# Patient Record
Sex: Male | Born: 1945
Health system: Southern US, Community
[De-identification: ages and names within clinical notes are randomized; demographics above are authoritative.]

## PROBLEM LIST (undated history)

## (undated) DIAGNOSIS — M549 Dorsalgia, unspecified: Secondary | ICD-10-CM

## (undated) DIAGNOSIS — I2510 Atherosclerotic heart disease of native coronary artery without angina pectoris: Secondary | ICD-10-CM

## (undated) DIAGNOSIS — I10 Essential (primary) hypertension: Secondary | ICD-10-CM

## (undated) DIAGNOSIS — R7302 Impaired glucose tolerance (oral): Secondary | ICD-10-CM

## (undated) DIAGNOSIS — E785 Hyperlipidemia, unspecified: Secondary | ICD-10-CM

## (undated) DIAGNOSIS — M1990 Unspecified osteoarthritis, unspecified site: Secondary | ICD-10-CM

## (undated) DIAGNOSIS — C801 Malignant (primary) neoplasm, unspecified: Secondary | ICD-10-CM

## (undated) DIAGNOSIS — T7840XA Allergy, unspecified, initial encounter: Secondary | ICD-10-CM

## (undated) DIAGNOSIS — H269 Unspecified cataract: Secondary | ICD-10-CM

## (undated) DIAGNOSIS — N390 Urinary tract infection, site not specified: Secondary | ICD-10-CM

## (undated) DIAGNOSIS — S22000A Wedge compression fracture of unspecified thoracic vertebra, initial encounter for closed fracture: Secondary | ICD-10-CM

## (undated) HISTORY — PX: POLYPECTOMY: SHX149

## (undated) HISTORY — DX: Dorsalgia, unspecified: M54.9

## (undated) HISTORY — DX: Impaired glucose tolerance (oral): R73.02

## (undated) HISTORY — DX: Unspecified cataract: H26.9

## (undated) HISTORY — PX: COLONOSCOPY: SHX174

## (undated) HISTORY — DX: Wedge compression fracture of unspecified thoracic vertebra, initial encounter for closed fracture: S22.000A

## (undated) HISTORY — DX: Unspecified osteoarthritis, unspecified site: M19.90

## (undated) HISTORY — DX: Hyperlipidemia, unspecified: E78.5

## (undated) HISTORY — DX: Essential (primary) hypertension: I10

## (undated) HISTORY — PX: CATARACT EXTRACTION, BILATERAL: SHX1313

## (undated) HISTORY — PX: HERNIA REPAIR: SHX51

## (undated) HISTORY — DX: Atherosclerotic heart disease of native coronary artery without angina pectoris: I25.10

## (undated) HISTORY — PX: EYE SURGERY: SHX253

## (undated) HISTORY — PX: DENTAL SURGERY: SHX609

## (undated) HISTORY — DX: Allergy, unspecified, initial encounter: T78.40XA

---

## 2004-03-23 ENCOUNTER — Ambulatory Visit: Payer: Self-pay | Admitting: Internal Medicine

## 2004-04-16 ENCOUNTER — Ambulatory Visit: Payer: Self-pay | Admitting: Internal Medicine

## 2004-06-14 ENCOUNTER — Ambulatory Visit: Payer: Self-pay | Admitting: Internal Medicine

## 2004-07-07 ENCOUNTER — Ambulatory Visit: Payer: Self-pay | Admitting: Gastroenterology

## 2004-07-21 ENCOUNTER — Ambulatory Visit: Payer: Self-pay | Admitting: Gastroenterology

## 2005-02-16 ENCOUNTER — Ambulatory Visit: Payer: Self-pay | Admitting: Internal Medicine

## 2005-03-16 ENCOUNTER — Ambulatory Visit: Payer: Self-pay | Admitting: Internal Medicine

## 2005-04-27 ENCOUNTER — Ambulatory Visit: Payer: Self-pay | Admitting: Internal Medicine

## 2005-08-15 ENCOUNTER — Ambulatory Visit: Payer: Self-pay | Admitting: Internal Medicine

## 2005-08-29 ENCOUNTER — Ambulatory Visit: Payer: Self-pay | Admitting: Internal Medicine

## 2006-08-24 ENCOUNTER — Ambulatory Visit: Payer: Self-pay | Admitting: Internal Medicine

## 2007-03-05 ENCOUNTER — Ambulatory Visit: Payer: Self-pay | Admitting: Internal Medicine

## 2007-03-05 LAB — CONVERTED CEMR LAB
ALT: 29 units/L (ref 0–53)
Alkaline Phosphatase: 76 units/L (ref 39–117)
BUN: 14 mg/dL (ref 6–23)
Basophils Absolute: 0 10*3/uL (ref 0.0–0.1)
Basophils Relative: 0.4 % (ref 0.0–1.0)
Bilirubin Urine: NEGATIVE
Blood in Urine, dipstick: NEGATIVE
Calcium: 9.4 mg/dL (ref 8.4–10.5)
Cholesterol: 196 mg/dL (ref 0–200)
Eosinophils Absolute: 0.2 10*3/uL (ref 0.0–0.6)
GFR calc Af Amer: 110 mL/min
GFR calc non Af Amer: 91 mL/min
HDL: 32.4 mg/dL — ABNORMAL LOW (ref >39.0)
Ketones, urine, test strip: NEGATIVE
Lymphocytes Relative: 35.5 % (ref 12.0–46.0)
MCHC: 33.8 g/dL (ref 30.0–36.0)
MCV: 90.9 fL (ref 78.0–100.0)
Monocytes Absolute: 0.6 10*3/uL (ref 0.2–0.7)
Monocytes Relative: 9.9 % (ref 3.0–11.0)
Neutro Abs: 3.4 10*3/uL (ref 1.4–7.7)
Nitrite: NEGATIVE
Platelets: 186 10*3/uL (ref 150–400)
Potassium: 4.4 meq/L (ref 3.5–5.1)
TSH: 2.59 microintl units/mL (ref 0.35–5.50)
Triglycerides: 104 mg/dL (ref 0–149)
VLDL: 21 mg/dL (ref 0–40)

## 2007-03-09 ENCOUNTER — Encounter: Payer: Self-pay | Admitting: Internal Medicine

## 2007-03-12 ENCOUNTER — Ambulatory Visit: Payer: Self-pay | Admitting: Internal Medicine

## 2007-03-12 DIAGNOSIS — M549 Dorsalgia, unspecified: Secondary | ICD-10-CM | POA: Insufficient documentation

## 2007-03-12 DIAGNOSIS — I10 Essential (primary) hypertension: Secondary | ICD-10-CM

## 2007-03-12 DIAGNOSIS — M1990 Unspecified osteoarthritis, unspecified site: Secondary | ICD-10-CM | POA: Insufficient documentation

## 2007-03-12 HISTORY — DX: Unspecified osteoarthritis, unspecified site: M19.90

## 2007-03-12 HISTORY — DX: Essential (primary) hypertension: I10

## 2007-03-12 HISTORY — DX: Dorsalgia, unspecified: M54.9

## 2008-04-18 HISTORY — PX: CARDIAC CATHETERIZATION: SHX172

## 2008-07-31 ENCOUNTER — Inpatient Hospital Stay (HOSPITAL_COMMUNITY): Admission: EM | Admit: 2008-07-31 | Discharge: 2008-08-01 | Payer: Self-pay | Admitting: Emergency Medicine

## 2008-08-14 ENCOUNTER — Encounter: Payer: Self-pay | Admitting: Internal Medicine

## 2008-08-18 ENCOUNTER — Ambulatory Visit: Payer: Self-pay | Admitting: Internal Medicine

## 2008-08-18 DIAGNOSIS — I2510 Atherosclerotic heart disease of native coronary artery without angina pectoris: Secondary | ICD-10-CM

## 2008-08-18 DIAGNOSIS — E785 Hyperlipidemia, unspecified: Secondary | ICD-10-CM

## 2008-08-18 HISTORY — DX: Atherosclerotic heart disease of native coronary artery without angina pectoris: I25.10

## 2008-08-18 HISTORY — DX: Hyperlipidemia, unspecified: E78.5

## 2008-09-19 ENCOUNTER — Encounter: Payer: Self-pay | Admitting: Internal Medicine

## 2008-11-17 ENCOUNTER — Ambulatory Visit: Payer: Self-pay | Admitting: Internal Medicine

## 2008-11-17 LAB — CONVERTED CEMR LAB
AST: 24 units/L (ref 0–37)
Cholesterol: 131 mg/dL (ref 0–200)

## 2009-03-23 ENCOUNTER — Ambulatory Visit: Payer: Self-pay | Admitting: Internal Medicine

## 2009-03-23 LAB — CONVERTED CEMR LAB: Cholesterol: 127 mg/dL (ref 0–200)

## 2009-07-21 ENCOUNTER — Ambulatory Visit: Payer: Self-pay | Admitting: Internal Medicine

## 2009-07-21 LAB — CONVERTED CEMR LAB
ALT: 31 units/L (ref 0–53)
AST: 25 units/L (ref 0–37)
Albumin: 4.1 g/dL (ref 3.5–5.2)
Alkaline Phosphatase: 82 units/L (ref 39–117)
Basophils Relative: 0.8 % (ref 0.0–3.0)
Calcium: 8.9 mg/dL (ref 8.4–10.5)
Eosinophils Relative: 2.6 % (ref 0.0–5.0)
GFR calc non Af Amer: 90.35 mL/min (ref >60)
Glucose, Bld: 113 mg/dL — ABNORMAL HIGH (ref 70–99)
Glucose, Urine, Semiquant: NEGATIVE
HCT: 42.7 % (ref 39.0–52.0)
HDL: 42.6 mg/dL (ref >39.00)
Hemoglobin: 14.6 g/dL (ref 13.0–17.0)
Lymphocytes Relative: 34.1 % (ref 12.0–46.0)
Lymphs Abs: 2.2 10*3/uL (ref 0.7–4.0)
Monocytes Relative: 9.7 % (ref 3.0–12.0)
Neutro Abs: 3.3 10*3/uL (ref 1.4–7.7)
Nitrite: NEGATIVE
Potassium: 4.6 meq/L (ref 3.5–5.1)
RBC: 4.72 M/uL (ref 4.22–5.81)
RDW: 14.6 % (ref 11.5–14.6)
Sodium: 139 meq/L (ref 135–145)
Specific Gravity, Urine: >=1.03
TSH: 1.92 microintl units/mL (ref 0.35–5.50)
Total CHOL/HDL Ratio: 3
Total Protein: 6.6 g/dL (ref 6.0–8.3)
VLDL: 11.6 mg/dL (ref 0.0–40.0)
WBC Urine, dipstick: NEGATIVE
pH: 5.5

## 2009-07-28 ENCOUNTER — Ambulatory Visit: Payer: Self-pay | Admitting: Internal Medicine

## 2010-05-18 NOTE — Assessment & Plan Note (Signed)
Summary: cpx/njr   History of Present Illness: 65 year old patient who is in today for a wellness exam.  Medical problems include minor coronary artery disease, dyslipidemia, and osteoarthritis.  He has mild treated hypertension.  He is dome well.  He also the history of mild impaired glucose tolerance  Preventive Screening-Counseling & Management  Alcohol-Tobacco     Smoking Status: never  Caffeine-Diet-Exercise     Does Patient Exercise: no  Allergies: 1)  Iodine (Iodine)  Past History:  Past Medical History: traumatic T8 compression fracture Hypertension Osteoarthritis Coronary artery disease (30% RCA 4-10) Hyperlipidemia IGT  Past Surgical History: Inguinal herniorrhaphy cardiac catheterization April 2010 colonoscopy  2008?  Family History: Reviewed history from 03/12/2007 and no changes required. father died in his 22s.  History Parkinson's disease, cerebrovascular disease, coronary artery disease, status post MI  Mother history of diabetes macular degeneration, status post nephrectomy for renal cancer  One sister, history of diabetes, and obesity, status post gastric bypass  Social History: Reviewed history and no changes required. Married Never Smoked (few yrs early 35s) Regular exercise-no Smoking Status:  never Does Patient Exercise:  no  Review of Systems  The patient denies anorexia, fever, weight loss, weight gain, vision loss, decreased hearing, hoarseness, chest pain, syncope, dyspnea on exertion, peripheral edema, prolonged cough, headaches, hemoptysis, abdominal pain, melena, hematochezia, severe indigestion/heartburn, hematuria, incontinence, genital sores, muscle weakness, suspicious skin lesions, transient blindness, difficulty walking, depression, unusual weight change, abnormal bleeding, enlarged lymph nodes, angioedema, breast masses, and testicular masses.    Physical Exam  General:  overweight-appearing.  normal blood  pressureoverweight-appearing.   Head:  Normocephalic and atraumatic without obvious abnormalities. No apparent alopecia or balding. Eyes:  No corneal or conjunctival inflammation noted. EOMI. Perrla. Funduscopic exam benign, without hemorrhages, exudates or papilledema. Vision grossly normal. Ears:  External ear exam shows no significant lesions or deformities.  Otoscopic examination reveals clear canals, tympanic membranes are intact bilaterally without bulging, retraction, inflammation or discharge. Hearing is grossly normal bilaterally. hearing aids in place bilaterally Nose:  External nasal examination shows no deformity or inflammation. Nasal mucosa are pink and moist without lesions or exudates. Mouth:  Oral mucosa and oropharynx without lesions or exudates.  Teeth in good repair. Neck:  No deformities, masses, or tenderness noted. Chest Wall:  No deformities, masses, tenderness or gynecomastia noted. Lungs:  Normal respiratory effort, chest expands symmetrically. Lungs are clear to auscultation, no crackles or wheezes. Heart:  Normal rate and regular rhythm. S1 and S2 normal without gallop, murmur, click, rub or other extra sounds. Abdomen:  Bowel sounds positive,abdomen soft and non-tender without masses, organomegaly or hernias noted. Rectal:  No external abnormalities noted. Normal sphincter tone. No rectal masses or tenderness. Genitalia:  Testes bilaterally descended without nodularity, tenderness or masses. No scrotal masses or lesions. No penis lesions or urethral discharge. Prostate:  Prostate gland firm and smooth, no enlargement, nodularity, tenderness, mass, asymmetry or induration. Msk:  No deformity or scoliosis noted of thoracic or lumbar spine.   Pulses:  R and L carotid,radial,femoral,dorsalis pedis and posterior tibial pulses are full and equal bilaterally Extremities:  No clubbing, cyanosis, edema, or deformity noted with normal full range of motion of all joints.     Neurologic:  No cranial nerve deficits noted. Station and gait are normal. Plantar reflexes are down-going bilaterally. DTRs are symmetrical throughout. Sensory, motor and coordinative functions appear intact.alert & oriented X3, cranial nerves II-XII intact, strength normal in all extremities, and gait normal.  alert & oriented  X3, cranial nerves II-XII intact, strength normal in all extremities, and gait normal.   Skin:  Intact without suspicious lesions or rashes Cervical Nodes:  No lymphadenopathy noted Axillary Nodes:  No palpable lymphadenopathy Inguinal Nodes:  No significant adenopathy Psych:  Cognition and judgment appear intact. Alert and cooperative with normal attention span and concentration. No apparent delusions, illusions, hallucinations   Impression & Recommendations:  Problem # 1:  PHYSICAL EXAMINATION, NORMAL (ICD-V70.0)  Complete Medication List: 1)  Aspirin 81 Mg Tbec (Aspirin) .... 2 once daily 2)  Naproxen Dr 500 Mg Tbec (Naproxen) .... Take 1 tablet by mouth twice a day 3)  Lisinopril 10 Mg Tabs (Lisinopril) .Marland Kitchen.. 1 once daily 4)  Crestor 10 Mg Tabs (Rosuvastatin calcium) .Marland Kitchen.. 1 once daily  Patient Instructions: 1)  Please schedule a follow-up appointment in 1 year. 2)  Limit your Sodium (Salt). 3)  It is important that you exercise regularly at least 20 minutes 5 times a week. If you develop chest pain, have severe difficulty breathing, or feel very tired , stop exercising immediately and seek medical attention. 4)  You need to lose weight. Consider a lower calorie diet and regular exercise.  Prescriptions: CRESTOR 10 MG TABS (ROSUVASTATIN CALCIUM) 1 once daily  #90 x 6   Entered and Authorized by:   Gordy Savers  MD   Signed by:   Gordy Savers  MD on 07/28/2009   Method used:   Print then Give to Patient   RxID:   [id number] LISINOPRIL 10 MG TABS (LISINOPRIL) 1 once daily  #90 x 6   Entered and Authorized by:   Gordy Savers  MD    Signed by:   Gordy Savers  MD on 07/28/2009   Method used:   Print then Give to Patient   RxID:   [id number] NAPROXEN DR 500 MG TBEC (NAPROXEN) Take 1 tablet by mouth twice a day  #180 x 6   Entered and Authorized by:   Gordy Savers  MD   Signed by:   Gordy Savers  MD on 07/28/2009   Method used:   Print then Give to Patient   RxID:   [id number]

## 2010-07-28 LAB — CBC
Hemoglobin: 13.9 g/dL (ref 13.0–17.0)
MCHC: 34.3 g/dL (ref 30.0–36.0)
Platelets: 169 10*3/uL (ref 150–400)
RDW: 13.6 % (ref 11.5–15.5)

## 2010-07-28 LAB — CK TOTAL AND CKMB (NOT AT ARMC)
CK, MB: 1.7 ng/mL (ref 0.3–4.0)
Total CK: 60 U/L (ref 7–232)

## 2010-07-28 LAB — LIPID PANEL
HDL: 32 mg/dL — ABNORMAL LOW (ref >39)
LDL Cholesterol: 145 mg/dL — ABNORMAL HIGH (ref 0–99)
Triglycerides: 93 mg/dL (ref <150)
VLDL: 19 mg/dL (ref 0–40)

## 2010-07-28 LAB — BASIC METABOLIC PANEL
BUN: 15 mg/dL (ref 6–23)
CO2: 28 mEq/L (ref 19–32)
Calcium: 8.8 mg/dL (ref 8.4–10.5)
Creatinine, Ser: 0.86 mg/dL (ref 0.4–1.5)
GFR calc non Af Amer: >60 mL/min (ref >60)
Glucose, Bld: 100 mg/dL — ABNORMAL HIGH (ref 70–99)
Sodium: 136 mEq/L (ref 135–145)

## 2010-07-28 LAB — GLUCOSE, CAPILLARY: Glucose-Capillary: 110 mg/dL — ABNORMAL HIGH (ref 70–99)

## 2010-08-05 ENCOUNTER — Other Ambulatory Visit (INDEPENDENT_AMBULATORY_CARE_PROVIDER_SITE_OTHER): Payer: BLUE CROSS/BLUE SHIELD

## 2010-08-05 DIAGNOSIS — Z0000 Encounter for general adult medical examination without abnormal findings: Secondary | ICD-10-CM

## 2010-08-05 LAB — CBC WITH DIFFERENTIAL/PLATELET
Basophils Absolute: 0.1 10*3/uL (ref 0.0–0.1)
Eosinophils Absolute: 0.1 10*3/uL (ref 0.0–0.7)
HCT: 44.8 % (ref 39.0–52.0)
Hemoglobin: 15.2 g/dL (ref 13.0–17.0)
Lymphs Abs: 1.7 10*3/uL (ref 0.7–4.0)
MCHC: 34 g/dL (ref 30.0–36.0)
Monocytes Absolute: 0.5 10*3/uL (ref 0.1–1.0)
Neutro Abs: 3.7 10*3/uL (ref 1.4–7.7)
Neutrophils Relative %: 60.3 % (ref 43.0–77.0)
RDW: 14.1 % (ref 11.5–14.6)

## 2010-08-05 LAB — LIPID PANEL
Cholesterol: 150 mg/dL (ref 0–200)
HDL: 40.4 mg/dL (ref >39.00)
Triglycerides: 76 mg/dL (ref 0.0–149.0)
VLDL: 15.2 mg/dL (ref 0.0–40.0)

## 2010-08-05 LAB — POCT URINALYSIS DIPSTICK
Blood, UA: NEGATIVE
Ketones, UA: NEGATIVE
Protein, UA: NEGATIVE
Spec Grav, UA: 1.025
pH, UA: 5

## 2010-08-05 LAB — BASIC METABOLIC PANEL
BUN: 14 mg/dL (ref 6–23)
CO2: 28 mEq/L (ref 19–32)
Glucose, Bld: 91 mg/dL (ref 70–99)
Potassium: 4.6 mEq/L (ref 3.5–5.1)
Sodium: 139 mEq/L (ref 135–145)

## 2010-08-05 LAB — HEPATIC FUNCTION PANEL: Albumin: 4.1 g/dL (ref 3.5–5.2)

## 2010-08-05 LAB — PSA: PSA: 1.59 ng/mL (ref 0.10–4.00)

## 2010-08-11 ENCOUNTER — Encounter: Payer: Self-pay | Admitting: Internal Medicine

## 2010-08-13 ENCOUNTER — Encounter: Payer: Self-pay | Admitting: Internal Medicine

## 2010-08-13 ENCOUNTER — Other Ambulatory Visit: Payer: Self-pay | Admitting: Internal Medicine

## 2010-08-13 ENCOUNTER — Ambulatory Visit (INDEPENDENT_AMBULATORY_CARE_PROVIDER_SITE_OTHER): Payer: BLUE CROSS/BLUE SHIELD | Admitting: Internal Medicine

## 2010-08-13 VITALS — BP 130/82 | HR 80 | Temp 98.1°F | Resp 18 | Ht 71.5 in | Wt 233.0 lb

## 2010-08-13 DIAGNOSIS — Z23 Encounter for immunization: Secondary | ICD-10-CM

## 2010-08-13 DIAGNOSIS — I10 Essential (primary) hypertension: Secondary | ICD-10-CM

## 2010-08-13 DIAGNOSIS — Z0000 Encounter for general adult medical examination without abnormal findings: Secondary | ICD-10-CM

## 2010-08-13 MED ORDER — ALFUZOSIN HCL ER 10 MG PO TB24: 10.0000 mg | ORAL_TABLET | Freq: Every day | ORAL | Status: AC

## 2010-08-13 MED ORDER — SIMVASTATIN 20 MG PO TABS: 20.0000 mg | ORAL_TABLET | Freq: Every evening | ORAL | Status: DC

## 2010-08-13 MED ORDER — DIPHTH-ACELL PERTUSSIS-TETANUS 6.7-46.8-5 LF-MCG/0.5 IM SUSP: 0.5000 mL | Freq: Once | INTRAMUSCULAR | Status: AC

## 2010-08-13 NOTE — Progress Notes (Signed)
Subjective:    Patient ID: [name], male    DOB: Sep 20, 1945, 65 y.o.   MRN: [id number]  HPI 65 year old patient who is seen today for an annual physical  . He has a history of nonobstructive coronary artery disease hypertension and dyslipidemia. He has been on Crestor to his coronary artery disease. He is doing quite well. His only complaint is some urinary frequency. He has had a colonoscopy within the past 10 years . CC: Hospital f/u; feels ok..  History of Present Illness:  65 year old gentleman, who is seen today following a hospital discharge. He is noted with atypical cardiac symptoms included shoulder pain, dizziness, and weakness. He presented to an urgent care with a left bundle branch block. That was new compared to old tracings. He had a prompt cardiac catheterization that revealed single vessel RCA stenosis of 30 to 40%. He was discharged on Crestor and lisinopril and hydrochlorothiazide discontinued. He has done well. He has a history of treated hypertension  Allergies:  1) Iodine (Iodine)  Past History:  Past Medical History:  traumatic T8 compression fracture  Hypertension  Osteoarthritis  Coronary artery disease (30% RCA 4-10)  Hyperlipidemia  Family History:  Reviewed history from 03/12/2007 and no changes required:  father died in his 14s. History Parkinson's disease, cerebrovascular disease, coronary artery disease, status post MI  Mother history of diabetes macular degeneration, status post nephrectomy for renal cancer  One sister, history of diabetes, and obesity, status post gastric bypass  Social History:  Reviewed history and no changes required:     Review of Systems  Constitutional: Negative for fever, chills, activity change, appetite change and fatigue.  HENT: Negative for hearing loss, ear pain, congestion, rhinorrhea, sneezing, mouth sores, trouble swallowing, neck pain, neck stiffness, dental problem, voice change, sinus pressure and tinnitus.     Eyes: Negative for photophobia, pain, redness and visual disturbance.  Respiratory: Negative for apnea, cough, choking, chest tightness, shortness of breath and wheezing.   Cardiovascular: Negative for chest pain, palpitations and leg swelling.  Gastrointestinal: Negative for nausea, vomiting, abdominal pain, diarrhea, constipation, blood in stool, abdominal distention, anal bleeding and rectal pain.  Genitourinary: Positive for frequency. Negative for dysuria, urgency, hematuria, flank pain, decreased urine volume, discharge, penile swelling, scrotal swelling, difficulty urinating, genital sores and testicular pain.  Musculoskeletal: Negative for myalgias, back pain, joint swelling, arthralgias and gait problem.  Skin: Negative for color change, rash and wound.  Neurological: Negative for dizziness, tremors, seizures, syncope, facial asymmetry, speech difficulty, weakness, light-headedness, numbness and headaches.  Hematological: Negative for adenopathy. Does not bruise/bleed easily.  Psychiatric/Behavioral: Negative for suicidal ideas, hallucinations, behavioral problems, confusion, sleep disturbance, self-injury, dysphoric mood, decreased concentration and agitation. The patient is not nervous/anxious.        Objective:   Physical Exam  Constitutional: He appears well-developed and well-nourished.  HENT:  Head: Normocephalic and atraumatic.  Right Ear: External ear normal.  Left Ear: External ear normal.  Nose: Nose normal.  Mouth/Throat: Oropharynx is clear and moist.  Eyes: Conjunctivae and EOM are normal. Pupils are equal, round, and reactive to light. No scleral icterus.  Neck: Normal range of motion. Neck supple. No JVD present. No thyromegaly present.  Cardiovascular: Regular rhythm, normal heart sounds and intact distal pulses.  Exam reveals no gallop and no friction rub.   No murmur heard. Pulmonary/Chest: Effort normal and breath sounds normal. He exhibits no tenderness.   Abdominal: Soft. Bowel sounds are normal. He exhibits no distension and no mass. There  is no tenderness.  Genitourinary: Prostate normal and penis normal.       +2 enlarged  Musculoskeletal: Normal range of motion. He exhibits no edema and no tenderness.  Lymphadenopathy:    He has no cervical adenopathy.  Neurological: He is alert. He has normal reflexes. No cranial nerve deficit. Coordination normal.  Skin: Skin is warm and dry. No rash noted.  Psychiatric: He has a normal mood and affect. His behavior is normal.          Assessment & Plan:  Coronary artery disease. Asymptomatic Statin therapy. We'll switch to simvastatin due to cost considerations Hypertension resume lisinopril that he stopped within the past few weeks  Mild obesity. Weight loss exercise are encouraged Mild BPH. He is requesting a trial of Uroxatral.

## 2010-08-13 NOTE — Patient Instructions (Signed)
You need to lose weight.  Consider a lower calorie diet and regular exercise.  Please check your blood pressure on a regular basis.  If it is consistently greater than 150/90, please make an office appointment.    It is important that you exercise regularly, at least 20 minutes 3 to 4 times per week.  If you develop chest pain or shortness of breath seek  medical attention.  Return in one year for follow-up

## 2010-08-13 NOTE — Progress Notes (Signed)
Addended by: Duard Brady on: 08/13/2010 11:37 AM   Modules accepted: Orders

## 2010-08-31 NOTE — Discharge Summary (Signed)
NAME:  [name], [name]                ACCOUNT NO.:  [id number]   MEDICAL RECORD NO.:  [id number]          PATIENT TYPE:  INP   LOCATION:  3731                         FACILITY:  [facility]   PHYSICIAN:  Cristy Hilts. Jacinto Halim, MD       DATE OF BIRTH:  1945-09-07   DATE OF ADMISSION:  07/31/2008  DATE OF DISCHARGE:  08/01/2008                               DISCHARGE SUMMARY   DISCHARGE DIAGNOSES:  1. Right arm pain, dyspnea, and dizziness, unclear etiology.  2. Mild coronary disease and catheterization with slow filling of his      coronaries and 30-40% right coronary artery narrowing.  3. Treated hypertension.  4. Dyslipidemia, with an LDL of 145.   HOSPITAL COURSE:  The patient is a 65 year old male with hypertension  who is referred to Dr. Jacinto Halim for dizziness, dyspnea, and some right arm  discomfort.  He had presented to [facility] ER.  There was some concern that  he had a new left bundle-branch block and he was taken urgently to cath  lab on admission at [facility] ER.  This was July 31, 2008.  Catheterization  revealed an EF of 60%, no AI, no abdominal aortic aneurysm, normal  renals and iliacs.  He did have some slow filling in the coronaries with  suspicion for an endothelial dysfunction.  Plan was for medical therapy.  He still had some dizziness and actually he said he got a little  diaphoretic after he got up in the morning after his cath.  He had a  headache, but this may have been from the nitrites.  We sent him down  for a CT of his head that showed a questionable old stroke versus  artifact.  There were no acute findings.  We will treat the patient with  Antivert as he has had some inner ear dizziness in the past.  If he is  feeling better, we will let him go home later this evening.  We did stop  his nitrite.   LABORATORY DATA:  CK-MB and troponins were negative.  Cholesterol was  196, HDL 32, LDL 145, sodium 136, potassium 4.3, BUN 15, creatinine 0.8.  White count 8.9, hemoglobin 13.9,  hematocrit 40, and platelets 169.  Chest x-ray shows no active disease.  CT is as noted above.  EKG from  the ER shows sinus rhythm, left bundle-branch block which apparently is  old and not new.   DISCHARGE MEDICATIONS:  1. Antivert 25 mg t.i.d. p.r.n.  2. Crestor 10 mg a day.  3. Lisinopril 10 mg a day.  4. Baby aspirin 2 daily.   DISPOSITION:  The patient is discharged in stable condition and will  follow up with Dr. Jacinto Halim.      Abelino Derrick, P.A.      Cristy Hilts. Jacinto Halim, MD  Electronically Signed    LKK/MEDQ  D:  08/01/2008  T:  08/02/2008  Job:  [id number]   cc:   Cristy Hilts. Jacinto Halim, MD  Gordy Savers, MD

## 2010-08-31 NOTE — H&P (Signed)
[name], [name]                ACCOUNT NO.:  [id number]   MEDICAL RECORD NO.:  [id number]          PATIENT TYPE:  INP   LOCATION:  2807                         FACILITY:  [facility]   PHYSICIAN:  Cristy Hilts. Jacinto Halim, MD       DATE OF BIRTH:  [birth year]   DATE OF ADMISSION:  07/31/2008  DATE OF DISCHARGE:                              HISTORY & PHYSICAL   CHIEF COMPLAINT:  Right arm discomfort with radiation to his neck and  dizziness for the last two and a half days.   HISTORY:  [name] is a 65 year old gentleman with hypertension  who has been taken care by Dr. Eleonore Chiquito for hypertension with  HCTZ who has a strong family history of premature coronary disease.  He  has been doing well until 3 days ago, he started to notice right arm  discomfort.  He has been complaining of right arm discomfort, radiation  to his neck associated with dizziness.  Given this he waited for 2 days,  he eventually presented to the [facility] Emergency Room for further  follow-up and a CEA and a CBC he was the results he came to the  emergency room complaining of these symptoms.  He was found to have a  new left bundle branch block.  Given this a STEMI protocol was activated  and he was brought directly to the cardiac catheterization lab to  evaluate his coronary anatomy.   The patient states the discomfort is ongoing for the last 2 days and is  continuous.  He denies any shortness of breath, hemoptysis, syncope.  He  denies any headache, nausea, vomiting or visual disturbances or  diaphoresis associated with these symptoms.   PAST MEDICAL HISTORY:  Hypertension.   PAST SURGICAL HISTORY:  Remote cholecystectomy.   FAMILY HISTORY:  Strong family history of premature coronary disease.  Father had myocardial infarction in his 74s.  He eventually died at age  of 67 with a second myocardial infarction.  Mother is a diabetic and  sister who is 49 also a diabetic.   SOCIAL HISTORY:  Is married,  lives with his wife, drinks occasional  alcohol.  Does not smoke tobacco.  Tries to walk at least every day.   PHYSICAL EXAM:  He is well-built and obese, he appears to be in no acute  distress.  VITAL SIGNS:  Heart rate of 32 beats per minute, respirations were 14,  blood pressure 124/78.  CARDIAC:  S1 - S2 is normal without any gallops.  CHEST:  Clear.  ABDOMEN:  Soft.  EXTREMITIES:  No edema warm.  Peripheral vascular exam normal.   EKG is normal, new onset left bundle branch block.   IMPRESSION:  Right arm pain with radiation to neck, I can not exclude  unstable angina with new-onset left bundle branch block connected to the  acute coronary syndrome with myocardial infarction.  He will be taken  directly to cardiac catheterization lab to evaluate his coronary artery.  Patient understands risks, benefits and alternatives to this.      Cristy Hilts. Jacinto Halim, MD  Electronically Signed     JRG/MEDQ  D:  07/31/2008  T:  07/31/2008  Job:  [id number]   cc:   Gordy Savers, MD

## 2010-08-31 NOTE — Cardiovascular Report (Signed)
NAME:  [name], [name]                ACCOUNT NO.:  [id number]   MEDICAL RECORD NO.:  [id number]          PATIENT TYPE:  INP   LOCATION:  3731                         FACILITY:  [facility]   PHYSICIAN:  Cristy Hilts. Jacinto Halim, MD       DATE OF BIRTH:  11-Aug-1945   DATE OF PROCEDURE:  07/31/2008  DATE OF DISCHARGE:                            CARDIAC CATHETERIZATION   PROCEDURES PERFORMED:  1. Left ventriculography.  2. Selective right and left coronary arteriography.  3. Ascending aortogram.  4. Abdominal aortogram.   INDICATIONS:  [name] is a 65 year old gentleman with known  hypertension, strong family history of premature coronary artery disease  who was admitted via [facility] Emergency Room.  He had presented with  severe right arm discomfort, associated dizziness and was found to have  a new left bundle-branch block.  Given this with the family history of  coronary artery disease, he is now brought as an urgent acute myocardial  infarction to the Cardiac Catheterization Lab.  Ascending aortogram was  performed to exclude ascending aortic dissection and abdominal aortogram  for abdominal aortic atherosclerosis given significant diffuse coronary  artery disease.   HEMODYNAMIC DATA:  The left ventricular pressure was 124/3 with end-  diastolic pressure of 14 mmHg.  The aortic pressure was 121/77 with a  mean of 97 mmHg.  There was no significant pressure gradient across the  aortic valve.   ANGIOGRAPHIC DATA:  Left ventricle.  Left ventricular systolic function  was normal with ejection fraction of 60%.  There was no significant  mitral regurgitation.   Right coronary.  Right coronary artery is a large caliber vessel.  It  gives origin to a large PLA and a small-to-moderate-sized PDA.  At the  bifurcation of the right ventricular branch, there is a 40% smooth  stenosis.  The PLA branch has diffuse 20-30% stenosis in the mid  segment.  There was SLOW FILLING noted throughout the  right coronary  artery, which improved with intracoronary nitroglycerin administration.   Left main coronary.  Left main coronary artery is a large caliber  vessel.  It is smooth and normal.   LAD.  LAD is a large caliber vessel.  It has got mild diffuse luminal  irregularity.  Gives origin to large diagonal-1.  Again SLOW FILLING was  noted.   Ramus intermediate.  Ramus intermediate is a moderate caliber vessel, it  is smooth and normal.   Circumflex.  Circumflex artery is a moderate caliber vessel.  Again SLOW  FILLING was noted.   IMPRESSION:  1. No acute culprit stenosis in the coronary arteries with normal left      ventricular systolic function.  No regional wall motion      abnormality.  I suspect the left bundle-branch block is probably      secondary to conduction disease than true coronary artery disease.  2. He has clear-cut endothelial dysfunction with SLOW FILLING, which      improved with intracoronary nitroglycerin administration.   RECOMMENDATIONS:  Therapy for endothelial dysfunction with statin is  indicated to be continued risk  modification.  The patient will be  discharged in the morning and evaluation for noncardiac causes of right  arm discomfort and dizziness is indicated.   TECHNIQUE OF THE PROCEDURE:  Under usual sterile precautions using a 6-  French right femoral artery access, 6-French multipurpose B2 catheter  was advanced into the ascending aorta and then to the left ventricle.  Left ventriculography was performed both in LAO and RAO projection.  Catheter was pulled into the ascending aorta.  Right coronary artery was  selectively engaged and angiography was performed.  Then, the catheter  was pulled back in the abdominal aorta and abdominal aortogram was  performed and the catheter pulled out of the body.  The patient  tolerated the procedure well.  No immediate complications were noted.      Cristy Hilts. Jacinto Halim, MD  Electronically Signed      JRG/MEDQ  D:  07/31/2008  T:  08/01/2008  Job:  [id number]   cc:   Gordy Savers, MD

## 2010-10-19 ENCOUNTER — Other Ambulatory Visit: Payer: Self-pay | Admitting: Internal Medicine

## 2011-01-04 ENCOUNTER — Encounter: Payer: Self-pay | Admitting: Internal Medicine

## 2011-01-04 ENCOUNTER — Ambulatory Visit (INDEPENDENT_AMBULATORY_CARE_PROVIDER_SITE_OTHER): Payer: Medicare Other | Admitting: Internal Medicine

## 2011-01-04 DIAGNOSIS — E785 Hyperlipidemia, unspecified: Secondary | ICD-10-CM

## 2011-01-04 DIAGNOSIS — I2510 Atherosclerotic heart disease of native coronary artery without angina pectoris: Secondary | ICD-10-CM

## 2011-01-04 DIAGNOSIS — I10 Essential (primary) hypertension: Secondary | ICD-10-CM

## 2011-01-04 DIAGNOSIS — J069 Acute upper respiratory infection, unspecified: Secondary | ICD-10-CM

## 2011-01-04 NOTE — Progress Notes (Signed)
  Subjective:    Patient ID: [name], male    DOB: 02-15-1946, 65 y.o.   MRN: [id number]  HPI  65 year old patient who has a history of hypertension dyslipidemia and nonobstructive coronary artery disease. For the past 4 weeks he has had postnasal drip sinus congestion and cough. Cough is largely nonproductive denies any chest pain shortness of breath wheezing. No fever or chills. He has been using OTC medications without much benefit in general feels well    Review of Systems  Constitutional: Positive for fatigue. Negative for fever, chills and appetite change.  HENT: Positive for congestion and rhinorrhea. Negative for hearing loss, ear pain, sore throat, trouble swallowing, neck stiffness, dental problem, voice change and tinnitus.   Eyes: Negative for pain, discharge and visual disturbance.  Respiratory: Positive for cough. Negative for chest tightness, wheezing and stridor.   Cardiovascular: Negative for chest pain, palpitations and leg swelling.  Gastrointestinal: Negative for nausea, vomiting, abdominal pain, diarrhea, constipation, blood in stool and abdominal distention.  Genitourinary: Negative for urgency, hematuria, flank pain, discharge, difficulty urinating and genital sores.  Musculoskeletal: Negative for myalgias, back pain, joint swelling, arthralgias and gait problem.  Skin: Negative for rash.  Neurological: Negative for dizziness, syncope, speech difficulty, weakness, numbness and headaches.  Hematological: Negative for adenopathy. Does not bruise/bleed easily.  Psychiatric/Behavioral: Negative for behavioral problems and dysphoric mood. The patient is not nervous/anxious.        Objective:   Physical Exam  Constitutional: He is oriented to person, place, and time. He appears well-developed.  HENT:  Head: Normocephalic.  Right Ear: External ear normal.  Left Ear: External ear normal.  Eyes: Conjunctivae and EOM are normal.  Neck: Normal range of motion.    Cardiovascular: Normal rate and normal heart sounds.   Pulmonary/Chest: Breath sounds normal.  Abdominal: Bowel sounds are normal.  Musculoskeletal: Normal range of motion. He exhibits no edema and no tenderness.  Neurological: He is alert and oriented to person, place, and time.  Psychiatric: He has a normal mood and affect. His behavior is normal.          Assessment & Plan:    There are URI. Will treat symptomatically samples of decongestants expectorants dispensed. Was also given a sample of Nasonex to use daily Hypertension controlled Dyslipidemia Nonobstructive coronary artery disease  We'll see for his annual physical in the spring. We'll continue symptomatic treatment. We'll call if there is any clinical worsening

## 2011-01-04 NOTE — Patient Instructions (Signed)
Get plenty of rest, Drink lots of  clear liquids, and use Tylenol or ibuprofen for fever and discomfort.    Call or return to clinic prn if these symptoms worsen or fail to improve as anticipated.  

## 2011-01-12 ENCOUNTER — Telehealth: Payer: Self-pay | Admitting: Internal Medicine

## 2011-01-12 MED ORDER — HYDROCODONE-HOMATROPINE 5-1.5 MG/5ML PO SYRP: 5.0000 mL | ORAL_SOLUTION | Freq: Four times a day (QID) | ORAL | Status: AC | PRN

## 2011-01-12 NOTE — Telephone Encounter (Signed)
[facility] 

## 2011-01-12 NOTE — Telephone Encounter (Signed)
Generic hydromet  6 ounces.  1 teaspoon every 6 hours as needed for cough 

## 2011-01-12 NOTE — Telephone Encounter (Signed)
Pt was given sample nasal congestion. Pt has cough now requesting something call into [facility] [place] [phone]

## 2011-01-13 ENCOUNTER — Telehealth: Payer: Self-pay | Admitting: Internal Medicine

## 2011-03-15 NOTE — Telephone Encounter (Signed)
Chart opened in error

## 2011-08-01 ENCOUNTER — Other Ambulatory Visit (INDEPENDENT_AMBULATORY_CARE_PROVIDER_SITE_OTHER): Payer: Medicare Other

## 2011-08-01 DIAGNOSIS — I10 Essential (primary) hypertension: Secondary | ICD-10-CM

## 2011-08-01 DIAGNOSIS — R3911 Hesitancy of micturition: Secondary | ICD-10-CM

## 2011-08-01 DIAGNOSIS — Z0000 Encounter for general adult medical examination without abnormal findings: Secondary | ICD-10-CM

## 2011-08-01 DIAGNOSIS — E785 Hyperlipidemia, unspecified: Secondary | ICD-10-CM

## 2011-08-01 LAB — POCT URINALYSIS DIPSTICK
Blood, UA: NEGATIVE
Protein, UA: NEGATIVE
Spec Grav, UA: 1.02
Urobilinogen, UA: 0.2
pH, UA: 5.5

## 2011-08-02 LAB — TSH: TSH: 1.63 u[IU]/mL (ref 0.35–5.50)

## 2011-08-02 LAB — HEPATIC FUNCTION PANEL
ALT: 37 U/L (ref 0–53)
Albumin: 4.4 g/dL (ref 3.5–5.2)
Total Protein: 6.7 g/dL (ref 6.0–8.3)

## 2011-08-02 LAB — CBC WITH DIFFERENTIAL/PLATELET
Basophils Relative: 0.4 % (ref 0.0–3.0)
Eosinophils Relative: 1.3 % (ref 0.0–5.0)
HCT: 43.4 % (ref 39.0–52.0)
Lymphs Abs: 2.2 10*3/uL (ref 0.7–4.0)
MCV: 90.1 fl (ref 78.0–100.0)
Monocytes Absolute: 0.6 10*3/uL (ref 0.1–1.0)
Platelets: 200 10*3/uL (ref 150.0–400.0)
WBC: 8.6 10*3/uL (ref 4.5–10.5)

## 2011-08-02 LAB — LIPID PANEL
Cholesterol: 147 mg/dL (ref 0–200)
LDL Cholesterol: 95 mg/dL (ref 0–99)
VLDL: 16.6 mg/dL (ref 0.0–40.0)

## 2011-08-02 LAB — BASIC METABOLIC PANEL
BUN: 14 mg/dL (ref 6–23)
Chloride: 104 mEq/L (ref 96–112)
Potassium: 4.1 mEq/L (ref 3.5–5.1)

## 2011-08-02 LAB — PSA: PSA: 2.52 ng/mL (ref 0.10–4.00)

## 2011-08-08 ENCOUNTER — Encounter: Payer: Self-pay | Admitting: Internal Medicine

## 2011-08-08 ENCOUNTER — Encounter: Payer: Self-pay | Admitting: Family Medicine

## 2011-08-16 ENCOUNTER — Ambulatory Visit (INDEPENDENT_AMBULATORY_CARE_PROVIDER_SITE_OTHER): Payer: Medicare Other | Admitting: Internal Medicine

## 2011-08-16 ENCOUNTER — Encounter: Payer: Self-pay | Admitting: Internal Medicine

## 2011-08-16 VITALS — BP 110/80 | HR 74 | Temp 97.9°F | Resp 18 | Ht 71.5 in | Wt 236.0 lb

## 2011-08-16 DIAGNOSIS — I10 Essential (primary) hypertension: Secondary | ICD-10-CM

## 2011-08-16 DIAGNOSIS — M1990 Unspecified osteoarthritis, unspecified site: Secondary | ICD-10-CM

## 2011-08-16 DIAGNOSIS — Z0000 Encounter for general adult medical examination without abnormal findings: Secondary | ICD-10-CM

## 2011-08-16 DIAGNOSIS — M549 Dorsalgia, unspecified: Secondary | ICD-10-CM

## 2011-08-16 DIAGNOSIS — I2510 Atherosclerotic heart disease of native coronary artery without angina pectoris: Secondary | ICD-10-CM

## 2011-08-16 DIAGNOSIS — Z23 Encounter for immunization: Secondary | ICD-10-CM

## 2011-08-16 MED ORDER — SIMVASTATIN 20 MG PO TABS: 20.0000 mg | ORAL_TABLET | Freq: Every day | ORAL | Status: DC

## 2011-08-16 MED ORDER — LISINOPRIL 10 MG PO TABS: 20.0000 mg | ORAL_TABLET | Freq: Every day | ORAL | Status: DC

## 2011-08-16 NOTE — Progress Notes (Signed)
Subjective:    Patient ID: [name], male    DOB: Feb 03, 1946, 66 y.o.   MRN: [id number]  HPI  66 year-old patient who is seen today for an annual physical . He has a history of nonobstructive coronary artery disease hypertension and dyslipidemia. He has been on Crestor due to  his coronary artery disease. He is doing quite well. His only complaint is some urinary frequency. He has had a colonoscopy within the past 10 years; over the past several months he has had worsening pain in the right hip and right posterior thigh; this is described as a burning quality pain. No motor weakness .   Allergies:  1) Iodine (Iodine)   Past History:  Past Medical History:  traumatic T8 compression fracture  Hypertension  Osteoarthritis  Coronary artery disease (30% RCA 4-10)  Hyperlipidemia   Family History:  Reviewed history from 03/12/2007 and no changes required:  father died in his 41s. History Parkinson's disease, cerebrovascular disease, coronary artery disease, status post MI  Mother history of diabetes macular degeneration, status post nephrectomy for renal cancer  One sister, history of diabetes, and obesity, status post gastric bypass   Social History:  Reviewed history and no changes required:   1. Risk factors, based on past  M,S,F history- patient has known nonobstructive coronary artery disease risk factors include hypertension dyslipidemia and a family history of coronary artery disease  2.  Physical activities: No regular exercise regimen and no activity restrictions  3.  Depression/mood: No history of depression or mood disorder  4.  Hearing: Mild deficits and wears hearing aids  5.  ADL's: Independent in all aspects of daily living  6.  Fall risk: Low  7.  Home safety: No problems identified  8.  Height weight, and visual acuity; height and weight stable no change in visual acuity wears glasses  9.  Counseling: Heart healthy diet regular exercise weight loss all  encouraged  10. Lab orders based on risk factors: Laboratory profile reviewed  11. Referral : Not appropriate at this time  12. Care plan: Weight loss recommended. Will also be given a plan for stretching and back exercises  13. Cognitive assessment: Alert and oriented with normal affect. No cognitive dysfunction       Review of Systems  Constitutional: Negative for fever, chills, activity change, appetite change and fatigue.  HENT: Negative for hearing loss, ear pain, congestion, rhinorrhea, sneezing, mouth sores, trouble swallowing, neck pain, neck stiffness, dental problem, voice change, sinus pressure and tinnitus.   Eyes: Negative for photophobia, pain, redness and visual disturbance.  Respiratory: Negative for apnea, cough, choking, chest tightness, shortness of breath and wheezing.   Cardiovascular: Negative for chest pain, palpitations and leg swelling.  Gastrointestinal: Negative for nausea, vomiting, abdominal pain, diarrhea, constipation, blood in stool, abdominal distention, anal bleeding and rectal pain.  Genitourinary: Negative for dysuria, urgency, frequency, hematuria, flank pain, decreased urine volume, discharge, penile swelling, scrotal swelling, difficulty urinating, genital sores and testicular pain.  Musculoskeletal: Positive for back pain. Negative for myalgias, joint swelling, arthralgias and gait problem.  Skin: Negative for color change, rash and wound.  Neurological: Negative for dizziness, tremors, seizures, syncope, facial asymmetry, speech difficulty, weakness, light-headedness, numbness (burning dysesthesias right hip and right posterior thigh) and headaches.  Hematological: Negative for adenopathy. Does not bruise/bleed easily.  Psychiatric/Behavioral: Negative for suicidal ideas, hallucinations, behavioral problems, confusion, sleep disturbance, self-injury, dysphoric mood, decreased concentration and agitation. The patient is not nervous/anxious.  Objective:   Physical Exam  Constitutional: He appears well-developed and well-nourished.       Weight 236 blood pressure well controlled  HENT:  Head: Normocephalic and atraumatic.  Right Ear: External ear normal.  Left Ear: External ear normal.  Nose: Nose normal.  Mouth/Throat: Oropharynx is clear and moist.  Eyes: Conjunctivae and EOM are normal. Pupils are equal, round, and reactive to light. No scleral icterus.  Neck: Normal range of motion. Neck supple. No JVD present. No thyromegaly present.  Cardiovascular: Regular rhythm, normal heart sounds and intact distal pulses.  Exam reveals no gallop and no friction rub.   No murmur heard. Pulmonary/Chest: Effort normal and breath sounds normal. He exhibits no tenderness.  Abdominal: Soft. Bowel sounds are normal. He exhibits no distension and no mass. There is no tenderness.  Genitourinary: Prostate normal and penis normal. Guaiac negative stool.  Musculoskeletal: Normal range of motion. He exhibits no edema and no tenderness.       Negative straight leg test. Very tight hamstring musculature  Lymphadenopathy:    He has no cervical adenopathy.  Neurological: He is alert. He has normal reflexes. No cranial nerve deficit. Coordination normal.  Skin: Skin is warm and dry. No rash noted.  Psychiatric: He has a normal mood and affect. His behavior is normal.          Assessment & Plan:   Preventive health examination Probable lumbar radiculopathy. We'll consider a lumbar MRI. We'll do the patient instructions for back exercises and strengthening Hypertension controlled Coronary artery disease asymptomatic

## 2011-08-16 NOTE — Patient Instructions (Signed)
Limit your sodium (Salt) intake    It is important that you exercise regularly, at least 20 minutes 3 to 4 times per week.  If you develop chest pain or shortness of breath seek  medical attention.  You need to lose weight.  Consider a lower calorie diet and regular exercise.Back Injury Prevention Back injuries are extremely painful, difficult to heal, and have an effect on everything you do. After suffering one back injury, you are much more likely to experience another later on. It is important to learn how to avoid injuring or re-injuring your back. You can learn proper lifting techniques and the basics of back safety, saving yourself a lot of pain and a lifetime of back problems.   WHY DO BACK INJURIES OCCUR?  The lower part of the back holds most of the body's weight.   Every time you bend over, lift a heavy object, or sit leaning forward, you put stress on your spine.   Over time, the discs between your vertebrae can start to wear out and become damaged.   Repetitive bending and lifting can quickly cause back problems.   Even leaning forward while sitting at a desk or table can eventually cause damage and pain.  The following are contributing factors, and related tips to prevent back injury. POOR PHYSICAL CONDITION, EXTRA WEIGHT, AND INACTIVITY  Your stomach muscles provide a lot of the support needed by your back.   If you have weak stomach muscles, your back may not get all the support it needs, especially when you are lifting or carrying heavy objects.   Good general physical condition is important for preventing strains, sprains, and other injuries. Exercise regularly and try to develop good tone in your abdominal (stomach) muscles.   The more you weigh, the more stress is placed on your back every time you bend over, at a ratio of 10:1. For every pound of weight, 10 times that amount of pressure is placed on the back.   Regular aerobic exercise (walking, jogging, biking,  swimming) has been shown to decrease back injuries.   Exercises that increase balance and strength can decrease your risk of falling and injuring your back or breaking bones. Exercises such as tai chi and yoga, or any weight-bearing exercise that challenges your balance, are good for increasing balance and strength.   Stretching and strengthening exercises can also reduce the risk of injuries.   Maintaining your ideal body weight is also important for having a healthy back.  DIET  In order to keep your spine strong, you will need to get enough calcium and vitamin D in your diet, to help prevent osteoporosis (bones becoming full of pores and weak, with age or diet deficiency).   Osteoporosis is responsible for many bone fractures that lead to back pain.   Calcium can be found in dairy products, green, leafy vegetables, and products with calcium added (fortified), like some orange juices.   Although your skin makes vitamin D when you are in the sun, you can also get it from your diet.   Vitamin D is found in milk and foods that have this vitamin added (fortified).   Many adults do not get enough calcium and vitamin D in their diet.   You should talk to your caregiver about how much calcium and vitamin D you need per day. Consider taking a nutritional supplement or a multivitamin.  POOR POSTURE  Sit and stand up straight.   It is best to try to  maintain the back in its natural, slight "S" shaped curve.   Avoid leaning forward (unsupported) when you sit, or hunching over while you are standing.   A chair with good lumbar (low back) support helps protect the back when you sit.   If you work at a desk, sit close to your work so you do not need to lean over. Keep your chin tucked in. Keep your neck drawn back and elbows bent at 90 degrees to your spine.   Sit high and close to the steering wheel when you drive. Add a lumbar support to your car seat, if needed.   Avoid sitting or standing  too long in 1 position. Sitting can be hard on the lower back. Take breaks, get up, stretch and walk around frequently (at least every hour).   Avoid sleeping in an unnatural position. Sleep on your side (with knees slightly bent), or on your back (with a pillow under your knees). Do not sleep on your stomach.  OVEREXERTION AND SUDDEN TWISTS OR MOVEMENTS  Avoid working in odd, uncomfortable positions, such as when gardening, kneeling, or doing tasks that require you to bend over for long periods of time.   Strech before exerting: If you know you will be doing work that might stress your back, take the time to stretch and loosen your muscles before starting, just like an athlete before a workout. This will help you avoid painful strains and sprains.   Slow down: If you are doing a lot of heavy, repetitive lifting, work slowly. Allow yourself more recovery time between lifts. Over working your back will cost you more time later, when you need medical attention or when you find every movement painful.   It is important to recognize your physical limitations and abilities.   Do not hesitate to say, "This is too heavy for me to lift alone."   Many people have injured their backs because they were afraid to ask for help. Ask for help!   Certain actions, motions and movements are more likely than others to cause or contribute to back injuries.   Avoid heavy lifting, especially repetitive lifting over a long period of time.   Avoid twisting at the waist, while lifting or holding a heavy load.   Avoid reaching and lifting over your head, across a table, or for an object on an elevated surface. Do not lean forward and lift heavy objects that are far from your body.   Concentrate on keeping your shoulders pulled back and your low back straight.   Never bend over without bending your knees.   Bend at your knees, instead of your back, when you pick up objects. Instead of using your back like a crane,  let your legs do the work.   Try not to lift heavy things higher than your waist, or reach for objects on shelves above your head.   When lifting:   Take a balanced stance, with your feet about shoulder width apart. One foot can be behind the object and the other next to it.   Squat down to lift the object, but keep your heels off the floor.   Get as close to the object to be lifted as you can.   Lift gradually, without jerking, using (tightening) your leg, abdominal and buttock muscles. Keep the load as close to you as possible.   Keep your back straight.   Keep your chin tucked in, to maintain a relatively straight back and neck line.  Once you are standing, change directions by pointing your feet in the direction you want to go and turning your whole body. Avoid twisting at your waist while carrying a load.   When you put a load down, use these same guidelines in reverse.   Reduce the amount of weight lifted. If you're moving books, it is better to load several small boxes rather than 1 heavy box.   Use handles and lifting straps.   Do not turn or twist while holding an object. Turn at your feet, not your back.   Avoid lifting or carrying objects with awkward or odd shapes. Get help if the shape is too awkward for you to lift and move by yourself.   The use of wide elastic belts, that can be worn and tightened to "pull in" lumbar and abdominal muscles, to prevent low back pain, is controversial.  STRESS  Tense muscles are more vulnerable to strains and spasms.   Mental stress that you experience is directed to your muscles, neck, and back.   Find constructive ways, including exercise and other relaxation techniques, to decrease the stress you experience and decrease its impact on your body.   Massage can help reduce the stress built up in your muscles and back.  ENVIRONMENTAL CAUSES AND SOLUTIONS  You could injure your back from slipping on a wet floor or ice. Avoid wet  and newly mopped surfaces, and make sure that ice around your home and office walkways is removed or treated.   Sleeping on a mattress that is too soft or too hard can hurt your back. If too soft, consider inserting a large plywood board between your mattress and box spring, or replacing your mattress. Mattresses and box springs should be replaced every 10 to 15 years, depending on the product.   Place, store, and position objects up off the floor. That way, you will not need to reach down to pick them up again.   Raise or lower shelves, so you do not need to reach, or twist your back, neck, and shoulders.   Put heavier objects on shelves at waist level, and lighter objects on lower or higher shelves.   Use carts and dollies to move objects, rather than carrying them yourself.   It is better to push a cart, dolly, lawnmower, or wheelbarrow, than it is to pull it. If you do need to pull it, force yourself to tighten your stomach muscles and try to maintain good body posture.   Use cranes, hoists, a lift table, or other lift-assist devices whenever you can.  Your caregiver can provide additional information about preventing back (and other injuries) when you seek care, to avoid a first or recurrent back injury. If you injure your back, visit your caregiver so you can be assessed and treated.   Document Released: 05/12/2004 Document Revised: 03/24/2011 Document Reviewed: 02/02/2009 [facility] Patient Information 2012 [place], [place].

## 2011-08-21 ENCOUNTER — Other Ambulatory Visit: Payer: Self-pay | Admitting: Internal Medicine

## 2011-09-08 ENCOUNTER — Ambulatory Visit (INDEPENDENT_AMBULATORY_CARE_PROVIDER_SITE_OTHER): Payer: Medicare Other | Admitting: Internal Medicine

## 2011-09-08 VITALS — BP 144/91 | HR 84 | Temp 98.3°F | Resp 16

## 2011-09-08 DIAGNOSIS — Z283 Underimmunization status: Secondary | ICD-10-CM

## 2011-09-08 DIAGNOSIS — Z2839 Other underimmunization status: Secondary | ICD-10-CM

## 2011-09-08 DIAGNOSIS — Z23 Encounter for immunization: Secondary | ICD-10-CM

## 2011-09-08 DIAGNOSIS — S0180XA Unspecified open wound of other part of head, initial encounter: Secondary | ICD-10-CM

## 2011-09-08 DIAGNOSIS — S0990XA Unspecified injury of head, initial encounter: Secondary | ICD-10-CM

## 2011-09-08 MED ORDER — TETANUS-DIPHTH-ACELL PERTUSSIS 5-2.5-18.5 LF-MCG/0.5 IM SUSP
0.5000 mL | Freq: Once | INTRAMUSCULAR | Status: AC
  Administered 2011-09-08: 0.5 mL via INTRAMUSCULAR

## 2011-09-08 NOTE — Patient Instructions (Signed)

## 2011-09-08 NOTE — Progress Notes (Signed)
  Subjective:    Patient ID: [name], male    DOB: Aug 08, 1945, 66 y.o.   MRN: [id number]  HPITripped getting out of car and struck head on pavement as well as knees on pavement No loss of consciousness but laceration above the right orbit   No recent tetanus Review of Systems     Objective:   Physical Exam 2.5 cm laceration just lateral to the right upper orbital rhythm/no swelling of the orbit/pupils equal round reactive to light and accommodation/EOMs conjugate/cranial nerves II through XII intact/Romberg negative Both knees with abrasions but no injury to the patellar or internal structures       Assessment & Plan:  Problem #1 laceration face From head injury without loss of consciousness/neuro exam stable Problem #2 abrasions knees  wound repair/Wound care/ suture removal in 7 days Tdap

## 2011-09-08 NOTE — Progress Notes (Signed)
Verbal consent obtained from the patient.  Local anesthesia with 2cc Lidocaine 2% with epinephrine.  Wound scrubbed with soap and water and rinsed.  Wound closed with #4 5-0 Prolene horizontal mattress sutures.  Wound cleansed and dressed.  Abrasions on the knees scrubbed with soap and water, rinsed and dressed.

## 2011-09-16 ENCOUNTER — Ambulatory Visit (INDEPENDENT_AMBULATORY_CARE_PROVIDER_SITE_OTHER): Payer: Medicare Other | Admitting: Family Medicine

## 2011-09-16 VITALS — BP 117/71 | HR 85 | Temp 98.2°F | Resp 16 | Ht 71.0 in | Wt 236.0 lb

## 2011-09-16 DIAGNOSIS — IMO0002 Reserved for concepts with insufficient information to code with codable children: Secondary | ICD-10-CM

## 2011-09-16 DIAGNOSIS — S81009A Unspecified open wound, unspecified knee, initial encounter: Secondary | ICD-10-CM

## 2011-09-16 DIAGNOSIS — S0180XA Unspecified open wound of other part of head, initial encounter: Secondary | ICD-10-CM

## 2011-09-16 DIAGNOSIS — S01109A Unspecified open wound of unspecified eyelid and periocular area, initial encounter: Secondary | ICD-10-CM

## 2011-09-16 NOTE — Progress Notes (Signed)
Subjective: Here for sutures to be removed. His knee still hurts him where he got an abrasion on it. He's been using some Neosporin on it with a little relief.  Objective: Eyebrow looks good. Sutures removed without difficulty. Knee has a crusted abrasion. It looks very satisfactory. Minimally tender on the kneecap.  Assessment: Wound eyebrow Impression a  Plan: Reassurance return when necessary take

## 2012-07-19 ENCOUNTER — Other Ambulatory Visit: Payer: Self-pay | Admitting: Internal Medicine

## 2012-09-06 ENCOUNTER — Encounter: Payer: Self-pay | Admitting: Internal Medicine

## 2012-09-06 ENCOUNTER — Ambulatory Visit (INDEPENDENT_AMBULATORY_CARE_PROVIDER_SITE_OTHER): Payer: Medicare Other | Admitting: Internal Medicine

## 2012-09-06 VITALS — BP 136/90 | HR 83 | Temp 98.6°F | Resp 18 | Ht 70.75 in | Wt 236.0 lb

## 2012-09-06 DIAGNOSIS — I10 Essential (primary) hypertension: Secondary | ICD-10-CM

## 2012-09-06 DIAGNOSIS — I447 Left bundle-branch block, unspecified: Secondary | ICD-10-CM

## 2012-09-06 DIAGNOSIS — M549 Dorsalgia, unspecified: Secondary | ICD-10-CM

## 2012-09-06 DIAGNOSIS — I2510 Atherosclerotic heart disease of native coronary artery without angina pectoris: Secondary | ICD-10-CM

## 2012-09-06 DIAGNOSIS — Z0000 Encounter for general adult medical examination without abnormal findings: Secondary | ICD-10-CM

## 2012-09-06 DIAGNOSIS — M1990 Unspecified osteoarthritis, unspecified site: Secondary | ICD-10-CM

## 2012-09-06 DIAGNOSIS — E785 Hyperlipidemia, unspecified: Secondary | ICD-10-CM

## 2012-09-06 LAB — COMPREHENSIVE METABOLIC PANEL
ALT: 43 U/L (ref 0–53)
Albumin: 4.1 g/dL (ref 3.5–5.2)
CO2: 24 mEq/L (ref 19–32)
Calcium: 8.8 mg/dL (ref 8.4–10.5)
Chloride: 104 mEq/L (ref 96–112)
GFR: 102.51 mL/min (ref >60.00)
Potassium: 4.3 mEq/L (ref 3.5–5.1)
Sodium: 137 mEq/L (ref 135–145)
Total Protein: 7 g/dL (ref 6.0–8.3)

## 2012-09-06 LAB — CBC WITH DIFFERENTIAL/PLATELET
Basophils Relative: 0.8 % (ref 0.0–3.0)
Eosinophils Relative: 2.3 % (ref 0.0–5.0)
HCT: 45.9 % (ref 39.0–52.0)
Hemoglobin: 15.5 g/dL (ref 13.0–17.0)
Lymphs Abs: 2.4 10*3/uL (ref 0.7–4.0)
MCV: 88.9 fl (ref 78.0–100.0)
Monocytes Absolute: 0.6 10*3/uL (ref 0.1–1.0)
Neutro Abs: 3.9 10*3/uL (ref 1.4–7.7)
Neutrophils Relative %: 54.4 % (ref 43.0–77.0)
RBC: 5.16 Mil/uL (ref 4.22–5.81)

## 2012-09-06 LAB — LIPID PANEL
Total CHOL/HDL Ratio: 4
Triglycerides: 68 mg/dL (ref 0.0–149.0)

## 2012-09-06 MED ORDER — LISINOPRIL 20 MG PO TABS: 20.0000 mg | ORAL_TABLET | Freq: Every day | ORAL | Status: DC

## 2012-09-06 MED ORDER — SIMVASTATIN 20 MG PO TABS: ORAL_TABLET | ORAL | Status: DC

## 2012-09-06 NOTE — Patient Instructions (Signed)
Lumbar MRI as discussed  Limit your sodium (Salt) intake    It is important that you exercise regularly, at least 20 minutes 3 to 4 times per week.  If you develop chest pain or shortness of breath seek  medical attention.  You need to lose weight.  Consider a lower calorie diet and regular exercise.

## 2012-09-06 NOTE — Progress Notes (Signed)
Patient ID: [name], male   DOB: 1945-10-24, 67 y.o.   MRN: [id number]  Subjective:    Patient ID: [name], male    DOB: 1945-06-25, 67 y.o.   MRN: [id number]  HPI  67 year-old patient who is seen today for an annual physical . He has a history of nonobstructive coronary artery disease hypertension and dyslipidemia. He has been on simvastatin due to  his coronary artery disease. He is doing quite well. He has had a colonoscopy 2006; over the past several months he has had worsening pain in the right hip and right posterior thigh; this is described as a burning quality pain. No motor weakness. This has worsened since his physical one year ago .   Allergies:  1) Iodine (Iodine)   Past History:  Past Medical History:  traumatic T8 compression fracture  Hypertension  Osteoarthritis  Coronary artery disease (30% RCA 4-10)  Hyperlipidemia   Family History:  Reviewed history from 03/12/2007 and no changes required:  father died in his 3s. History Parkinson's disease, cerebrovascular disease, coronary artery disease, status post MI  Mother history of diabetes macular degeneration, status post nephrectomy for renal cancer  One sister, history of diabetes, and obesity, status post gastric bypass   Social History:  Reviewed history and no changes required:   1. Risk factors, based on past  M,S,F history- patient has known nonobstructive coronary artery disease risk factors include hypertension dyslipidemia and a family history of coronary artery disease  2.  Physical activities: No regular exercise regimen and no activity restrictions  3.  Depression/mood: No history of depression or mood disorder  4.  Hearing: Mild deficits and wears hearing aids  5.  ADL's: Independent in all aspects of daily living  6.  Fall risk: Low  7.  Home safety: No problems identified  8.  Height weight, and visual acuity; height and weight stable no change in visual acuity wears glasses  9.   Counseling: Heart healthy diet regular exercise weight loss all encouraged  10. Lab orders based on risk factors: Laboratory profile reviewed  11. Referral : Not appropriate at this time  12. Care plan: Weight loss recommended. Will also be given a plan for stretching and back exercises  13. Cognitive assessment: Alert and oriented with normal affect. No cognitive dysfunction       Review of Systems  Constitutional: Negative for fever, chills, activity change, appetite change and fatigue.  HENT: Negative for hearing loss, ear pain, congestion, rhinorrhea, sneezing, mouth sores, trouble swallowing, neck pain, neck stiffness, dental problem, voice change, sinus pressure and tinnitus.   Eyes: Negative for photophobia, pain, redness and visual disturbance.  Respiratory: Negative for apnea, cough, choking, chest tightness, shortness of breath and wheezing.   Cardiovascular: Negative for chest pain, palpitations and leg swelling.  Gastrointestinal: Negative for nausea, vomiting, abdominal pain, diarrhea, constipation, blood in stool, abdominal distention, anal bleeding and rectal pain.  Genitourinary: Negative for dysuria, urgency, frequency, hematuria, flank pain, decreased urine volume, discharge, penile swelling, scrotal swelling, difficulty urinating, genital sores and testicular pain.  Musculoskeletal: Positive for back pain. Negative for myalgias, joint swelling, arthralgias and gait problem.  Skin: Negative for color change, rash and wound.  Neurological: Negative for dizziness, tremors, seizures, syncope, facial asymmetry, speech difficulty, weakness, light-headedness, numbness (burning dysesthesias right hip and right posterior thigh) and headaches.  Hematological: Negative for adenopathy. Does not bruise/bleed easily.  Psychiatric/Behavioral: Negative for suicidal ideas, hallucinations, behavioral problems, confusion, sleep disturbance, self-injury, dysphoric mood,  decreased  concentration and agitation. The patient is not nervous/anxious.        Objective:   Physical Exam  Constitutional: He appears well-developed and well-nourished.  Weight 236 blood pressure well controlled  HENT:  Head: Normocephalic and atraumatic.  Right Ear: External ear normal.  Left Ear: External ear normal.  Nose: Nose normal.  Mouth/Throat: Oropharynx is clear and moist.  Eyes: Conjunctivae and EOM are normal. Pupils are equal, round, and reactive to light. No scleral icterus.  Neck: Normal range of motion. Neck supple. No JVD present. No thyromegaly present.  Cardiovascular: Regular rhythm, normal heart sounds and intact distal pulses.  Exam reveals no gallop and no friction rub.   No murmur heard. Pulmonary/Chest: Effort normal and breath sounds normal. He exhibits no tenderness.  Abdominal: Soft. Bowel sounds are normal. He exhibits no distension and no mass. There is no tenderness.  Genitourinary: Prostate normal and penis normal. Guaiac negative stool.  Musculoskeletal: Normal range of motion. He exhibits no edema and no tenderness.  Negative straight leg test. Very tight hamstring musculature  Lymphadenopathy:    He has no cervical adenopathy.  Neurological: He is alert. He has normal reflexes. No cranial nerve deficit. Coordination normal.  Skin: Skin is warm and dry. No rash noted.  Psychiatric: He has a normal mood and affect. His behavior is normal.          Assessment & Plan:   Preventive health examination Probable lumbar radiculopathy. We'll obtain a lumbar MRI. We'll do the patient instructions for back exercises and strengthening Hypertension controlled Coronary artery disease asymptomatic

## 2012-09-15 ENCOUNTER — Other Ambulatory Visit: Payer: Medicare Other

## 2012-09-17 ENCOUNTER — Ambulatory Visit
Admission: RE | Admit: 2012-09-17 | Discharge: 2012-09-17 | Disposition: A | Discharge status: Home / Self Care | Payer: Medicare Other | Source: Ambulatory Visit | Attending: Internal Medicine | Admitting: Internal Medicine

## 2012-09-17 DIAGNOSIS — M549 Dorsalgia, unspecified: Secondary | ICD-10-CM

## 2013-01-14 ENCOUNTER — Other Ambulatory Visit: Payer: Self-pay | Admitting: Internal Medicine

## 2013-02-21 ENCOUNTER — Other Ambulatory Visit: Payer: Self-pay

## 2013-03-12 ENCOUNTER — Encounter: Payer: Self-pay | Admitting: Internal Medicine

## 2013-03-26 ENCOUNTER — Encounter: Payer: Self-pay | Admitting: Internal Medicine

## 2013-04-22 ENCOUNTER — Telehealth: Payer: Self-pay | Admitting: Internal Medicine

## 2013-04-22 NOTE — Telephone Encounter (Signed)
Patient Information:  Caller Name: [name]  Phone: [phone]  Patient: [name], [name]  Gender: Male  DOB: 1946/03/28  Age: 68 Years  PCP: Bluford Kaufmann (Family Practice > 86yrs old)  Office Follow Up:  Does the office need to follow up with this patient?: No  Instructions For The Office: N/A   Symptoms  Reason For Call & Symptoms: Pt has had clear congestion/cough ongoing x 3 weeks. Pt is afebrile but cough seems to be worsening and he would like antibiotics. RN advised and scheduled appointment based on triage.  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 So appt scheduled n Wed 04/24/12.

## 2013-04-24 ENCOUNTER — Ambulatory Visit (INDEPENDENT_AMBULATORY_CARE_PROVIDER_SITE_OTHER): Payer: Medicare Other | Admitting: Internal Medicine

## 2013-04-24 ENCOUNTER — Encounter: Payer: Self-pay | Admitting: Internal Medicine

## 2013-04-24 VITALS — BP 142/80 | HR 84 | Temp 98.6°F | Resp 20 | Ht 70.75 in | Wt 241.0 lb

## 2013-04-24 DIAGNOSIS — J069 Acute upper respiratory infection, unspecified: Secondary | ICD-10-CM

## 2013-04-24 DIAGNOSIS — I10 Essential (primary) hypertension: Secondary | ICD-10-CM

## 2013-04-24 DIAGNOSIS — B9789 Other viral agents as the cause of diseases classified elsewhere: Secondary | ICD-10-CM

## 2013-04-24 DIAGNOSIS — E785 Hyperlipidemia, unspecified: Secondary | ICD-10-CM

## 2013-04-24 MED ORDER — HYDROCODONE-HOMATROPINE 5-1.5 MG/5ML PO SYRP: 5.0000 mL | ORAL_SOLUTION | Freq: Four times a day (QID) | ORAL | Status: AC | PRN

## 2013-04-24 NOTE — Progress Notes (Signed)
Subjective:    Patient ID: [name], male    DOB: [birth year], 68 y.o.   MRN: [id number]  HPI  68 year old patient who has treated hypertension and dyslipidemia. He presents with history of cough postnasal drip and malaise. He has had some sinus and chest congestion. Cough now is largely nonproductive. He has failed a number of over-the-counter products  Past Medical History  Diagnosis Date  . BACK PAIN, CHRONIC 03/12/2007  . CORONARY ARTERY DISEASE 08/18/2008  . HYPERLIPIDEMIA 08/18/2008  . HYPERTENSION 03/12/2007  . OSTEOARTHRITIS 03/12/2007  . IGT (impaired glucose tolerance)   . Compression fracture of thoracic vertebra     traumatic    T8    History   Social History  . Marital Status: Married    Spouse Name: N/A    Number of Children: N/A  . Years of Education: N/A   Occupational History  . Not on file.   Social History Main Topics  . Smoking status: Former Smoker    Quit date: 04/18/1970  . Smokeless tobacco: Never Used  . Alcohol Use: Yes  . Drug Use: No  . Sexual Activity: Not on file   Other Topics Concern  . Not on file   Social History Narrative  . No narrative on file    Past Surgical History  Procedure Laterality Date  . Hernia repair      ingunial  . Cardiac catheterization  2010    Family History  Problem Relation Age of Onset  . Diabetes Mother   . Cancer Mother     renal  . Heart disease Father   . Stroke Father   . Diabetes Sister     Allergies  Allergen Reactions  . Iodine     REACTION: unspecified    Current Outpatient Prescriptions on File Prior to Visit  Medication Sig Dispense Refill  . aspirin 81 MG tablet Take by mouth. 2 tablet once daily       . lisinopril (PRINIVIL,ZESTRIL) 20 MG tablet Take 1 tablet (20 mg total) by mouth daily.  90 tablet  3  . Misc Natural Products (OSTEO BI-FLEX JOINT SHIELD PO) Take 2 tablets by mouth daily.       . simvastatin (ZOCOR) 20 MG tablet TAKE 1 TABLET IN THE EVENING  90 tablet  3    No current facility-administered medications on file prior to visit.    BP 142/80  Pulse 84  Temp(Src) 98.6 F (37 C) (Oral)  Resp 20  Ht 5' 10.75" (1.797 m)  Wt 241 lb (109.317 kg)  BMI 33.85 kg/m2  SpO2 98%     Review of Systems  Constitutional: Positive for activity change, appetite change and fatigue. Negative for fever and chills.  HENT: Positive for postnasal drip and rhinorrhea. Negative for congestion, dental problem, ear pain, hearing loss, sore throat, tinnitus, trouble swallowing and voice change.   Eyes: Negative for pain, discharge and visual disturbance.  Respiratory: Positive for cough. Negative for chest tightness, wheezing and stridor.   Cardiovascular: Negative for chest pain, palpitations and leg swelling.  Gastrointestinal: Negative for nausea, vomiting, abdominal pain, diarrhea, constipation, blood in stool and abdominal distention.  Genitourinary: Negative for urgency, hematuria, flank pain, discharge, difficulty urinating and genital sores.  Musculoskeletal: Negative for arthralgias, back pain, gait problem, joint swelling, myalgias and neck stiffness.  Skin: Negative for rash.  Neurological: Negative for dizziness, syncope, speech difficulty, weakness, numbness and headaches.  Hematological: Negative for adenopathy. Does not bruise/bleed easily.  Psychiatric/Behavioral: Negative  for behavioral problems and dysphoric mood. The patient is not nervous/anxious.        Objective:   Physical Exam  Constitutional: He is oriented to person, place, and time. He appears well-developed.  HENT:  Head: Normocephalic.  Right Ear: External ear normal.  Left Ear: External ear normal.  Eyes: Conjunctivae and EOM are normal.  Neck: Normal range of motion.  Cardiovascular: Normal rate and normal heart sounds.   Pulmonary/Chest: Breath sounds normal.  Abdominal: Bowel sounds are normal.  Musculoskeletal: Normal range of motion. He exhibits no edema and no  tenderness.  Neurological: He is alert and oriented to person, place, and time.  Psychiatric: He has a normal mood and affect. His behavior is normal.          Assessment & Plan:   Viral URI with cough. We'll treat symptomatically Hypertension stable Dyslipidemia   CPX 6 months

## 2013-04-24 NOTE — Patient Instructions (Signed)
Acute bronchitis symptoms are generally not helped by antibiotics.  Take over-the-counter expectorants and cough medications such as  Mucinex DM.  Call if there is no improvement in 5 to 7 days or if he developed worsening cough, fever, or new symptoms, such as shortness of breath or chest pain.

## 2013-04-24 NOTE — Progress Notes (Signed)
Pre-visit discussion using our clinic review tool. No additional management support is needed unless otherwise documented below in the visit note.  

## 2013-04-25 ENCOUNTER — Telehealth: Payer: Self-pay | Admitting: Internal Medicine

## 2013-04-25 NOTE — Telephone Encounter (Signed)
Relevant patient education assigned to patient using Emmi. ° °

## 2013-10-23 ENCOUNTER — Encounter: Payer: Self-pay | Admitting: Internal Medicine

## 2013-10-23 NOTE — Telephone Encounter (Signed)
FYI

## 2013-10-29 ENCOUNTER — Ambulatory Visit (INDEPENDENT_AMBULATORY_CARE_PROVIDER_SITE_OTHER): Payer: Medicare Other | Admitting: Internal Medicine

## 2013-10-29 ENCOUNTER — Encounter: Payer: Self-pay | Admitting: Internal Medicine

## 2013-10-29 VITALS — BP 140/76 | HR 77 | Temp 98.1°F | Resp 20 | Ht 71.5 in | Wt 239.0 lb

## 2013-10-29 DIAGNOSIS — I2510 Atherosclerotic heart disease of native coronary artery without angina pectoris: Secondary | ICD-10-CM

## 2013-10-29 DIAGNOSIS — M549 Dorsalgia, unspecified: Secondary | ICD-10-CM

## 2013-10-29 DIAGNOSIS — Z23 Encounter for immunization: Secondary | ICD-10-CM

## 2013-10-29 DIAGNOSIS — E785 Hyperlipidemia, unspecified: Secondary | ICD-10-CM

## 2013-10-29 DIAGNOSIS — I10 Essential (primary) hypertension: Secondary | ICD-10-CM

## 2013-10-29 DIAGNOSIS — M1990 Unspecified osteoarthritis, unspecified site: Secondary | ICD-10-CM

## 2013-10-29 LAB — CBC WITH DIFFERENTIAL/PLATELET
BASOS PCT: 0.5 % (ref 0.0–3.0)
Basophils Absolute: 0 10*3/uL (ref 0.0–0.1)
EOS ABS: 0.2 10*3/uL (ref 0.0–0.7)
EOS PCT: 2.5 % (ref 0.0–5.0)
HCT: 46.8 % (ref 39.0–52.0)
Hemoglobin: 15.4 g/dL (ref 13.0–17.0)
Lymphocytes Relative: 30.2 % (ref 12.0–46.0)
Lymphs Abs: 2.1 10*3/uL (ref 0.7–4.0)
MCHC: 32.9 g/dL (ref 30.0–36.0)
MCV: 91.3 fl (ref 78.0–100.0)
Monocytes Absolute: 0.6 10*3/uL (ref 0.1–1.0)
Monocytes Relative: 8.6 % (ref 3.0–12.0)
NEUTROS PCT: 58.2 % (ref 43.0–77.0)
Neutro Abs: 4.1 10*3/uL (ref 1.4–7.7)
Platelets: 179 10*3/uL (ref 150.0–400.0)
RBC: 5.12 Mil/uL (ref 4.22–5.81)
RDW: 13.8 % (ref 11.5–15.5)
WBC: 7 10*3/uL (ref 4.0–10.5)

## 2013-10-29 LAB — COMPREHENSIVE METABOLIC PANEL
ALBUMIN: 4.3 g/dL (ref 3.5–5.2)
ALT: 37 U/L (ref 0–53)
AST: 29 U/L (ref 0–37)
Alkaline Phosphatase: 66 U/L (ref 39–117)
BUN: 13 mg/dL (ref 6–23)
CALCIUM: 9.4 mg/dL (ref 8.4–10.5)
CHLORIDE: 104 meq/L (ref 96–112)
CO2: 26 mEq/L (ref 19–32)
Creatinine, Ser: 0.8 mg/dL (ref 0.4–1.5)
GFR: 96.56 mL/min (ref >60.00)
GLUCOSE: 113 mg/dL — AB (ref 70–99)
POTASSIUM: 4.4 meq/L (ref 3.5–5.1)
Sodium: 138 mEq/L (ref 135–145)
Total Bilirubin: 1.2 mg/dL (ref 0.2–1.2)
Total Protein: 7.1 g/dL (ref 6.0–8.3)

## 2013-10-29 LAB — LIPID PANEL
Cholesterol: 126 mg/dL (ref 0–200)
HDL: 38.5 mg/dL — ABNORMAL LOW (ref >39.00)
LDL Cholesterol: 72 mg/dL (ref 0–99)
NonHDL: 87.5
TRIGLYCERIDES: 80 mg/dL (ref 0.0–149.0)
Total CHOL/HDL Ratio: 3
VLDL: 16 mg/dL (ref 0.0–40.0)

## 2013-10-29 LAB — TSH: TSH: 2.02 u[IU]/mL (ref 0.35–4.50)

## 2013-10-29 MED ORDER — SIMVASTATIN 20 MG PO TABS: ORAL_TABLET | ORAL | Status: DC

## 2013-10-29 MED ORDER — LISINOPRIL 20 MG PO TABS: 20.0000 mg | ORAL_TABLET | Freq: Every day | ORAL | Status: DC

## 2013-10-29 NOTE — Patient Instructions (Signed)
Limit your sodium (Salt) intake    It is important that you exercise regularly, at least 20 minutes 3 to 4 times per week.  If you develop chest pain or shortness of breath seek  medical attention.  Please check your blood pressure on a regular basis.  If it is consistently greater than 150/90, please make an office appointment.  Return in one year for follow-up  

## 2013-10-29 NOTE — Progress Notes (Signed)
Patient ID: [name], male   DOB: 1945-06-09, 68 y.o.   MRN: [id number]  Subjective:    Patient ID: [name], male    DOB: 02/10/1946, 68 y.o.   MRN: [id number]  HPI   68  year-old patient who is seen today for an annual physical .  He has a history of nonobstructive coronary artery disease hypertension and dyslipidemia. He has been on simvastatin due to  his coronary artery disease. He is doing quite well. He has had a colonoscopy 2006; Patient continues to have painful dysesthesias involving his left hip and left posterior thigh area.  He has had some symptoms on the right side as well.  A lumbar MRI one year ago revealed the arthritic changes only. His cardiac status is stable Complaints of urinary frequency and urgency.  Does consume considerable amounts of Diet Coke throughout the day .   Allergies:  1) Iodine (Iodine)   Past History:  Past Medical History:  traumatic T8 compression fracture  Hypertension  Osteoarthritis  Coronary artery disease (30% RCA 4-10)  Hyperlipidemia   Family History:   father died in his 61s. History Parkinson's disease, cerebrovascular disease, coronary artery disease, status post MI  Mother history of diabetes macular degeneration, status post nephrectomy for renal cancer  One sister, history of diabetes, and obesity, status post gastric bypass   Social History:  Reviewed history and no changes required:    Medicare wellness:  1. Risk factors, based on past  M,S,F history- patient has known nonobstructive coronary artery disease risk factors include hypertension dyslipidemia and a family history of coronary artery disease  2.  Physical activities: No regular exercise regimen and no activity restrictions  3.  Depression/mood: No history of depression or mood disorder  4.  Hearing: Mild deficits and wears hearing aids  5.  ADL's: Independent in all aspects of daily living  6.  Fall risk: Low  7.  Home safety: No problems  identified  8.  Height weight, and visual acuity; height and weight stable no change in visual acuity wears glasses  9.  Counseling: Heart healthy diet regular exercise weight loss all encouraged  10. Lab orders based on risk factors: Laboratory profile reviewed  11. Referral : Not appropriate at this time  12. Care plan: Weight loss recommended. Will also be given a plan for stretching and back exercises  13. Cognitive assessment: Alert and oriented with normal affect. No cognitive dysfunction       Review of Systems  Constitutional: Negative for fever, chills, activity change, appetite change and fatigue.  HENT: Negative for congestion, dental problem, ear pain, hearing loss, mouth sores, rhinorrhea, sinus pressure, sneezing, tinnitus, trouble swallowing and voice change.   Eyes: Negative for photophobia, pain, redness and visual disturbance.  Respiratory: Negative for apnea, cough, choking, chest tightness, shortness of breath and wheezing.   Cardiovascular: Negative for chest pain, palpitations and leg swelling.  Gastrointestinal: Negative for nausea, vomiting, abdominal pain, diarrhea, constipation, blood in stool, abdominal distention, anal bleeding and rectal pain.  Genitourinary: Negative for dysuria, urgency, frequency, hematuria, flank pain, decreased urine volume, discharge, penile swelling, scrotal swelling, difficulty urinating, genital sores and testicular pain.  Musculoskeletal: Positive for back pain. Negative for arthralgias, gait problem, joint swelling, myalgias, neck pain and neck stiffness.  Skin: Negative for color change, rash and wound.  Neurological: Negative for dizziness, tremors, seizures, syncope, facial asymmetry, speech difficulty, weakness, light-headedness, numbness (burning dysesthesias right hip and right posterior thigh) and headaches.  Hematological:  Negative for adenopathy. Does not bruise/bleed easily.  Psychiatric/Behavioral: Negative for  suicidal ideas, hallucinations, behavioral problems, confusion, sleep disturbance, self-injury, dysphoric mood, decreased concentration and agitation. The patient is not nervous/anxious.        Objective:   Physical Exam  Constitutional: He appears well-developed and well-nourished.  Weight 236 blood pressure well controlled  HENT:  Head: Normocephalic and atraumatic.  Right Ear: External ear normal.  Left Ear: External ear normal.  Nose: Nose normal.  Mouth/Throat: Oropharynx is clear and moist.  Eyes: Conjunctivae and EOM are normal. Pupils are equal, round, and reactive to light. No scleral icterus.  Neck: Normal range of motion. Neck supple. No JVD present. No thyromegaly present.  Cardiovascular: Regular rhythm, normal heart sounds and intact distal pulses.  Exam reveals no gallop and no friction rub.   No murmur heard. Pulmonary/Chest: Effort normal and breath sounds normal. He exhibits no tenderness.  Abdominal: Soft. Bowel sounds are normal. He exhibits no distension and no mass. There is no tenderness.  Genitourinary: Penis normal. Guaiac negative stool.  Prostate moderately enlarged  Musculoskeletal: Normal range of motion. He exhibits no edema and no tenderness.  Lymphadenopathy:    He has no cervical adenopathy.  Neurological: He is alert. He has normal reflexes. No cranial nerve deficit. Coordination normal.  Skin: Skin is warm and dry. No rash noted.  Psychiatric: He has a normal mood and affect. His behavior is normal.          Assessment & Plan:   Preventive health examination  Hypertension controlled Coronary artery disease asymptomatic

## 2013-10-29 NOTE — Progress Notes (Signed)
Pre visit review using our clinic review tool, if applicable. No additional management support is needed unless otherwise documented below in the visit note. 

## 2013-11-05 ENCOUNTER — Encounter: Payer: Self-pay | Admitting: Internal Medicine

## 2013-11-12 ENCOUNTER — Encounter: Payer: Self-pay | Admitting: Physician Assistant

## 2013-11-12 ENCOUNTER — Ambulatory Visit (INDEPENDENT_AMBULATORY_CARE_PROVIDER_SITE_OTHER): Payer: Medicare Other | Admitting: Physician Assistant

## 2013-11-12 ENCOUNTER — Telehealth: Payer: Self-pay | Admitting: Physician Assistant

## 2013-11-12 ENCOUNTER — Ambulatory Visit (INDEPENDENT_AMBULATORY_CARE_PROVIDER_SITE_OTHER)
Admission: RE | Admit: 2013-11-12 | Discharge: 2013-11-12 | Disposition: A | Discharge status: Home / Self Care | Payer: Medicare Other | Source: Ambulatory Visit | Attending: Physician Assistant | Admitting: Physician Assistant

## 2013-11-12 ENCOUNTER — Encounter: Payer: Medicare Other | Admitting: Internal Medicine

## 2013-11-12 VITALS — BP 102/68 | HR 66 | Temp 98.5°F | Resp 18 | Wt 241.0 lb

## 2013-11-12 DIAGNOSIS — S99919A Unspecified injury of unspecified ankle, initial encounter: Secondary | ICD-10-CM

## 2013-11-12 DIAGNOSIS — S8990XA Unspecified injury of unspecified lower leg, initial encounter: Secondary | ICD-10-CM

## 2013-11-12 DIAGNOSIS — S99929A Unspecified injury of unspecified foot, initial encounter: Secondary | ICD-10-CM

## 2013-11-12 DIAGNOSIS — S99921A Unspecified injury of right foot, initial encounter: Secondary | ICD-10-CM

## 2013-11-12 MED ORDER — MELOXICAM 7.5 MG PO TABS
7.5000 mg | ORAL_TABLET | Freq: Every day | ORAL | Status: DC
Start: 2013-11-12 — End: 2014-09-09

## 2013-11-12 NOTE — Patient Instructions (Addendum)
Xray of your foot today at the [facility] office to rule out any deep retained metal fragment. We will call with the results of this when available.  No sign of infection currently, but continue to monitor the area for any signs of this, as they could still potentially develop.  Tetanus is up to date.  If emergency symptoms discussed during visit developed, seek medical attention immediately.  Followup as needed, or for worsening or persistent symptoms despite treatment.  Below is information on signs and symptoms of infection that may develop.   Cellulitis Cellulitis is an infection of the skin and the tissue under the skin. The infected area is usually red and tender. This happens most often in the arms and lower legs. HOME CARE   Take your antibiotic medicine as told. Finish the medicine even if you start to feel better.  Keep the infected arm or leg raised (elevated).  Put a warm cloth on the area up to 4 times per day.  Only take medicines as told by your doctor.  Keep all doctor visits as told. GET HELP IF:  You see red streaks on the skin coming from the infected area.  Your red area gets bigger or turns a dark color.  Your bone or joint under the infected area is painful after the skin heals.  Your infection comes back in the same area or different area.  You have a puffy (swollen) bump in the infected area.  You have new symptoms.  You have a fever. GET HELP RIGHT AWAY IF:   You feel very sleepy.  You throw up (vomit) or have watery poop (diarrhea).  You feel sick and have muscle aches and pains. MAKE SURE YOU:   Understand these instructions.  Will watch your condition.  Will get help right away if you are not doing well or get worse. Document Released: 09/21/2007 Document Revised: 08/19/2013 Document Reviewed: 06/20/2011 [facility] Patient Information 2015 [place], [place]. This information is not intended to replace advice given to you by your health care  provider. Make sure you discuss any questions you have with your health care provider.

## 2013-11-12 NOTE — Telephone Encounter (Signed)
Called and spoke with pt and pt is aware.  

## 2013-11-12 NOTE — Progress Notes (Signed)
Pre visit review using our clinic review tool, if applicable. No additional management support is needed unless otherwise documented below in the visit note. 

## 2013-11-12 NOTE — Progress Notes (Signed)
Subjective:    Patient ID: [name], male    DOB: [birth year], 68 y.o.   MRN: [id number]  HPI Patient is a 68 y.o. male presenting for suspected metal foreign body in foot. Pt states that he stepped on something metal, tac strip in carpet, about 10 days ago. He states that it didn't appear to have anything left in the foot at that time, just a deep puncture that was bleeding some. He states he cleaned it very well, and it was very sore at the time, however it did not bleed for very long. Tetanus UTD. He states that his foot still feels sore, has central swelling, and he is worried there there is something still in it. He has not noticed any red streaks, increased redness in the area, increased warmth to the touch, and nothing visibly stuck in the skin. No numbness or tingling. Moderate sharp pain. Pt has used hydrogen peroxide and neosporin and band aid. This did not provide any relief. Patient denies fevers, chills, nausea, vomiting, diarrhea, shortness of breath, chest pain, headache, syncope.    Review of Systems As per the history of present illness and are otherwise negative.   Past Medical History  Diagnosis Date  . BACK PAIN, CHRONIC 03/12/2007  . CORONARY ARTERY DISEASE 08/18/2008  . HYPERLIPIDEMIA 08/18/2008  . HYPERTENSION 03/12/2007  . OSTEOARTHRITIS 03/12/2007  . IGT (impaired glucose tolerance)   . Compression fracture of thoracic vertebra     traumatic    T8    History   Social History  . Marital Status: Married    Spouse Name: N/A    Number of Children: N/A  . Years of Education: N/A   Occupational History  . Not on file.   Social History Main Topics  . Smoking status: Former Smoker    Quit date: 04/18/1970  . Smokeless tobacco: Never Used  . Alcohol Use: Yes  . Drug Use: No  . Sexual Activity: Not on file   Other Topics Concern  . Not on file   Social History Narrative  . No narrative on file    Past Surgical History  Procedure Laterality Date  .  Hernia repair      ingunial  . Cardiac catheterization  2010    Family History  Problem Relation Age of Onset  . Diabetes Mother   . Cancer Mother     renal  . Heart disease Father   . Stroke Father   . Diabetes Sister     Allergies  Allergen Reactions  . Iodine     REACTION: unspecified    Current Outpatient Prescriptions on File Prior to Visit  Medication Sig Dispense Refill  . aspirin 81 MG tablet Take by mouth. 2 tablet once daily       . ibuprofen (ADVIL,MOTRIN) 200 MG tablet Take 200 mg by mouth every 6 (six) hours as needed.      Marland Kitchen lisinopril (PRINIVIL,ZESTRIL) 20 MG tablet Take 1 tablet (20 mg total) by mouth daily.  90 tablet  3  . naproxen sodium (ALEVE) 220 MG tablet Take 220 mg by mouth 2 (two) times daily with a meal.      . simvastatin (ZOCOR) 20 MG tablet TAKE 1 TABLET IN THE EVENING  90 tablet  3   No current facility-administered medications on file prior to visit.    EXAM: BP 102/68  Pulse 66  Temp(Src) 98.5 F (36.9 C) (Oral)  Resp 18  Wt 241 lb (109.317 kg)  Objective:   Physical Exam  Nursing note and vitals reviewed. Constitutional: He is oriented to person, place, and time. He appears well-developed and well-nourished. No distress.  HENT:  Head: Normocephalic and atraumatic.  Eyes: Conjunctivae and EOM are normal. Pupils are equal, round, and reactive to light.  Neck: Normal range of motion.  Cardiovascular: Normal rate, regular rhythm and intact distal pulses.   Pulmonary/Chest: Effort normal and breath sounds normal. No respiratory distress. He exhibits no tenderness.  Musculoskeletal: Normal range of motion. He exhibits tenderness. He exhibits no edema.  Mild ttp of area on left heel. Gait normal.  Neurological: He is alert and oriented to person, place, and time.  Sensation, strength, reflexes are grossly intact.  Skin: Skin is warm and dry. No rash noted. He is not diaphoretic. No erythema. No pallor.  Isolated approximately 1 cm  diameter raised area of swelling to the posterior left heel. This has a central healed puncture wound with some visible scab. There is no erythema to the area, no surrounding erythema, no excessive warmth, no fluctuance.  Psychiatric: He has a normal mood and affect. His behavior is normal. Judgment and thought content normal.     Lab Results  Component Value Date   WBC 7.0 10/29/2013   HGB 15.4 10/29/2013   HCT 46.8 10/29/2013   PLT 179.0 10/29/2013   GLUCOSE 113* 10/29/2013   CHOL 126 10/29/2013   TRIG 80.0 10/29/2013   HDL 38.50* 10/29/2013   LDLCALC 72 10/29/2013   ALT 37 10/29/2013   AST 29 10/29/2013   NA 138 10/29/2013   K 4.4 10/29/2013   CL 104 10/29/2013   CREATININE 0.8 10/29/2013   BUN 13 10/29/2013   CO2 26 10/29/2013   TSH 2.02 10/29/2013   PSA 2.52 08/01/2011   INR 1.1 07/31/2008         Assessment & Plan:  [name] was seen today for stepped on a metal item.  Diagnoses and associated orders for this visit:  Foot injury, right, initial encounter Comments: No sign of foreign body or infection. Will obtain xray to rule out deeper foreign body. - DG Foot Complete Left; Future    Rice therapy was discussed, as well as watchful waiting if no abnormality per x-ray. Any additional changes in plan pending x-ray results.  Return precautions provided, and patient handout on cellulitis for symptoms that may indicated an infection.  Plan to follow up as needed, or for worsening or persistent symptoms despite treatment.  Patient Instructions  Xray of your foot today at the [facility] office to rule out any deep retained metal fragment. We will call with the results of this when available.  No sign of infection currently, but continue to monitor the area for any signs of this, as they could still potentially develop.  Tetanus is up to date.  If emergency symptoms discussed during visit developed, seek medical attention immediately.  Followup as needed, or for worsening or persistent  symptoms despite treatment.  Below is information on signs and symptoms of infection that may develop.

## 2014-06-25 ENCOUNTER — Encounter: Payer: Self-pay | Admitting: Gastroenterology

## 2014-07-15 ENCOUNTER — Encounter: Payer: Self-pay | Admitting: Gastroenterology

## 2014-09-09 ENCOUNTER — Ambulatory Visit (AMBULATORY_SURGERY_CENTER): Payer: Self-pay | Admitting: *Deleted

## 2014-09-09 VITALS — Ht 71.0 in | Wt 243.2 lb

## 2014-09-09 DIAGNOSIS — Z1211 Encounter for screening for malignant neoplasm of colon: Secondary | ICD-10-CM

## 2014-09-09 NOTE — Progress Notes (Signed)
Denies allergies to eggs or soy products. Denies complications with sedation or anesthesia. Denies O2 use. Denies use of diet or weight loss medications.  Emmi instructions given for colonoscopy.  

## 2014-09-10 ENCOUNTER — Encounter: Payer: Self-pay | Admitting: Gastroenterology

## 2014-09-23 ENCOUNTER — Ambulatory Visit (AMBULATORY_SURGERY_CENTER): Payer: Medicare Other | Admitting: Gastroenterology

## 2014-09-23 ENCOUNTER — Encounter: Payer: Self-pay | Admitting: Gastroenterology

## 2014-09-23 VITALS — BP 117/75 | HR 75 | Temp 98.0°F | Resp 16 | Ht 71.0 in | Wt 243.0 lb

## 2014-09-23 DIAGNOSIS — K5730 Diverticulosis of large intestine without perforation or abscess without bleeding: Secondary | ICD-10-CM

## 2014-09-23 DIAGNOSIS — D120 Benign neoplasm of cecum: Secondary | ICD-10-CM

## 2014-09-23 DIAGNOSIS — Z1211 Encounter for screening for malignant neoplasm of colon: Secondary | ICD-10-CM

## 2014-09-23 DIAGNOSIS — I10 Essential (primary) hypertension: Secondary | ICD-10-CM | POA: Diagnosis not present

## 2014-09-23 MED ORDER — SODIUM CHLORIDE 0.9 % IV SOLN: 500.0000 mL | INTRAVENOUS | Status: DC

## 2014-09-23 NOTE — Progress Notes (Signed)
Report to PACU, RN, vss, BBS= Clear.  

## 2014-09-23 NOTE — Op Note (Signed)
[place]  [address]. [place], [id number]   COLONOSCOPY PROCEDURE REPORT  PATIENT: [name] [name]  MR#: [id number] BIRTHDATE: 1945/08/30 , 50  yrs. old GENDER: male ENDOSCOPIST: Inda Castle, MD REFERRED MG:QQPYP [name] [name], M.D. PROCEDURE DATE:  09/23/2014 PROCEDURE:   Colonoscopy, screening and Colonoscopy with snare polypectomy First Screening Colonoscopy - Avg.  risk and is 50 yrs.  old or older - No.  Prior Negative Screening - Now for repeat screening. 10 or more years since last screening  History of Adenoma - Now for follow-up colonoscopy & has been > or = to 3 yrs.  N/A  Polyps removed today? Yes ASA CLASS:   Class II INDICATIONS:Colorectal Neoplasm Risk Assessment for this procedure is average risk. MEDICATIONS: Monitored anesthesia care and Propofol 300 mg IV  DESCRIPTION OF PROCEDURE:   After the risks benefits and alternatives of the procedure were thoroughly explained, informed consent was obtained.  The digital rectal exam revealed no abnormalities of the rectum.   The [id number]  endoscope was introduced through the anus and advanced to the cecum, which was identified by both the appendix and ileocecal valve. No adverse events experienced.   The quality of the prep was (Suprep was used) good.  The instrument was then slowly withdrawn as the colon was fully examined. Estimated blood loss is zero unless otherwise noted in this procedure report.      COLON FINDINGS: There was moderate diverticulosis noted in the descending colon and sigmoid colon with associated muscular hypertrophy.   A flat polyp measuring 12 mm in size was found at the cecum.  A polypectomy was performed with a cold snare.  The resection was complete, the polyp tissue was completely retrieved and sent to histology.   The examination was otherwise normal. Retroflexed views revealed no abnormalities. The time to cecum = 6.5 Withdrawal time = 14.3   The scope  was withdrawn and the procedure completed. COMPLICATIONS: There were no immediate complications.  ENDOSCOPIC IMPRESSION: 1.   There was moderate diverticulosis noted in the descending colon and sigmoid colon 2.   Flat polyp was found at the cecum; polypectomy was performed with a cold snare 3.   The examination was otherwise normal  RECOMMENDATIONS: If the polyp(s) removed today are proven to be adenomatous (pre-cancerous) polyps, you will need a colonoscopy in 3 years. Otherwise you should continue to follow colorectal cancer screening guidelines for "routine risk" patients with a colonoscopy in 10 years.  You will receive a letter within 1-2 weeks with the results of your biopsy as well as final recommendations.  Please call my office if you have not received a letter after 3 weeks.  eSigned:  Inda Castle, MD 09/23/2014 11:17 AM   cc:   PATIENT NAME:  [name], [name] MR#: [id number]

## 2014-09-23 NOTE — Patient Instructions (Signed)
YOU HAD AN ENDOSCOPIC PROCEDURE TODAY AT THE [facility]:   Refer to the procedure report that was given to you for any specific questions about what was found during the examination.  If the procedure report does not answer your questions, please call your gastroenterologist to clarify.  If you requested that your care partner not be given the details of your procedure findings, then the procedure report has been included in a sealed envelope for you to review at your convenience later.  YOU SHOULD EXPECT: Some feelings of bloating in the abdomen. Passage of more gas than usual.  Walking can help get rid of the air that was put into your GI tract during the procedure and reduce the bloating. If you had a lower endoscopy (such as a colonoscopy or flexible sigmoidoscopy) you may notice spotting of blood in your stool or on the toilet paper. If you underwent a bowel prep for your procedure, you may not have a normal bowel movement for a few days.  Please Note:  You might notice some irritation and congestion in your nose or some drainage.  This is from the oxygen used during your procedure.  There is no need for concern and it should clear up in a day or so.  SYMPTOMS TO REPORT IMMEDIATELY:   Following lower endoscopy (colonoscopy or flexible sigmoidoscopy):  Excessive amounts of blood in the stool  Significant tenderness or worsening of abdominal pains  Swelling of the abdomen that is new, acute  Fever of 100F or higher   For urgent or emergent issues, a gastroenterologist can be reached at any hour by calling [phone].   DIET: Your first meal following the procedure should be a small meal and then it is ok to progress to your normal diet. Heavy or fried foods are harder to digest and may make you feel nauseous or bloated.  Likewise, meals heavy in dairy and vegetables can increase bloating.  Drink plenty of fluids but you should avoid alcoholic beverages for 24  hours.  ACTIVITY:  You should plan to take it easy for the rest of today and you should NOT DRIVE or use heavy machinery until tomorrow (because of the sedation medicines used during the test).    FOLLOW UP: Our staff will call the number listed on your records the next business day following your procedure to check on you and address any questions or concerns that you may have regarding the information given to you following your procedure. If we do not reach you, we will leave a message.  However, if you are feeling well and you are not experiencing any problems, there is no need to return our call.  We will assume that you have returned to your regular daily activities without incident.  If any biopsies were taken you will be contacted by phone or by letter within the next 1-3 weeks.  Please call us at [phone] if you have not heard about the biopsies in 3 weeks.    SIGNATURES/CONFIDENTIALITY: You and/or your care partner have signed paperwork which will be entered into your electronic medical record.  These signatures attest to the fact that that the information above on your After Visit Summary has been reviewed and is understood.  Full responsibility of the confidentiality of this discharge information lies with you and/or your care-partner. 

## 2014-09-23 NOTE — Progress Notes (Signed)
Called to room to assist during endoscopic procedure.  Patient ID and intended procedure confirmed with present staff. Received instructions for my participation in the procedure from the performing physician.  

## 2014-09-24 ENCOUNTER — Telehealth: Payer: Self-pay | Admitting: *Deleted

## 2014-09-24 NOTE — Telephone Encounter (Signed)
  Follow up Call-  Call back number 09/23/2014  Post procedure Call Back phone  # [phone]  Permission to leave phone message Yes     Patient questions:  Do you have a fever, pain , or abdominal swelling? No. Pain Score  0 *  Have you tolerated food without any problems? Yes.    Have you been able to return to your normal activities? Yes.    Do you have any questions about your discharge instructions: Diet   No. Medications  No. Follow up visit  No.  Do you have questions or concerns about your Care? No.  Actions: * If pain score is 4 or above: No action needed, pain <4.

## 2014-09-29 ENCOUNTER — Encounter: Payer: Self-pay | Admitting: Internal Medicine

## 2014-10-01 ENCOUNTER — Encounter: Payer: Self-pay | Admitting: Gastroenterology

## 2014-10-21 ENCOUNTER — Other Ambulatory Visit: Payer: Self-pay | Admitting: Internal Medicine

## 2014-12-23 ENCOUNTER — Encounter: Payer: Self-pay | Admitting: Internal Medicine

## 2014-12-23 ENCOUNTER — Ambulatory Visit (INDEPENDENT_AMBULATORY_CARE_PROVIDER_SITE_OTHER): Payer: Medicare Other | Admitting: Internal Medicine

## 2014-12-23 DIAGNOSIS — Z860100 Personal history of colon polyps, unspecified: Secondary | ICD-10-CM

## 2014-12-23 DIAGNOSIS — I2510 Atherosclerotic heart disease of native coronary artery without angina pectoris: Secondary | ICD-10-CM | POA: Diagnosis not present

## 2014-12-23 DIAGNOSIS — M8949 Other hypertrophic osteoarthropathy, multiple sites: Secondary | ICD-10-CM

## 2014-12-23 DIAGNOSIS — M159 Polyosteoarthritis, unspecified: Secondary | ICD-10-CM

## 2014-12-23 DIAGNOSIS — Z23 Encounter for immunization: Secondary | ICD-10-CM | POA: Diagnosis not present

## 2014-12-23 DIAGNOSIS — M150 Primary generalized (osteo)arthritis: Secondary | ICD-10-CM | POA: Diagnosis not present

## 2014-12-23 DIAGNOSIS — I10 Essential (primary) hypertension: Secondary | ICD-10-CM | POA: Diagnosis not present

## 2014-12-23 DIAGNOSIS — M5440 Lumbago with sciatica, unspecified side: Secondary | ICD-10-CM | POA: Diagnosis not present

## 2014-12-23 MED ORDER — METHYLPREDNISOLONE ACETATE 80 MG/ML IJ SUSP
80.0000 mg | Freq: Once | INTRAMUSCULAR | Status: AC
  Administered 2014-12-23: 80 mg via INTRAMUSCULAR

## 2014-12-23 MED ORDER — HYDROCODONE-ACETAMINOPHEN 10-325 MG PO TABS: 1.0000 | ORAL_TABLET | Freq: Three times a day (TID) | ORAL | Status: DC | PRN

## 2014-12-23 NOTE — Addendum Note (Signed)
Addended by: Marian Sorrow on: 12/23/2014 01:55 PM   Modules accepted: Orders

## 2014-12-23 NOTE — Patient Instructions (Signed)
Most patients with low back pain will improve with time over the next two to 6 weeks.  Keep active but avoid any activities that cause pain.    Back Pain, Adult Low back pain is very common. About 1 in 5 people have back pain.The cause of low back pain is rarely dangerous. The pain often gets better over time.About half of people with a sudden onset of back pain feel better in just 2 weeks. About 8 in 10 people feel better by 6 weeks.  CAUSES Some common causes of back pain include:  Strain of the muscles or ligaments supporting the spine.  Wear and tear (degeneration) of the spinal discs.  Arthritis.  Direct injury to the back. DIAGNOSIS Most of the time, the direct cause of low back pain is not known.However, back pain can be treated effectively even when the exact cause of the pain is unknown.Answering your caregiver's questions about your overall health and symptoms is one of the most accurate ways to make sure the cause of your pain is not dangerous. If your caregiver needs more information, he or she may order lab work or imaging tests (X-rays or MRIs).However, even if imaging tests show changes in your back, this usually does not require surgery. HOME CARE INSTRUCTIONS For many people, back pain returns.Since low back pain is rarely dangerous, it is often a condition that people can learn to [facility] their own.   Remain active. It is stressful on the back to sit or stand in one place. Do not sit, drive, or stand in one place for more than 30 minutes at a time. Take short walks on level surfaces as soon as pain allows.Try to increase the length of time you walk each day.  Do not stay in bed.Resting more than 1 or 2 days can delay your recovery.  Do not avoid exercise or work.Your body is made to move.It is not dangerous to be active, even though your back may hurt.Your back will likely heal faster if you return to being active before your pain is gone.  Pay attention to  your body when you bend and lift. Many people have less discomfortwhen lifting if they bend their knees, keep the load close to their bodies,and avoid twisting. Often, the most comfortable positions are those that put less stress on your recovering back.  Find a comfortable position to sleep. Use a firm mattress and lie on your side with your knees slightly bent. If you lie on your back, put a pillow under your knees.  Only take over-the-counter or prescription medicines as directed by your caregiver. Over-the-counter medicines to reduce pain and inflammation are often the most helpful.Your caregiver may prescribe muscle relaxant drugs.These medicines help dull your pain so you can more quickly return to your normal activities and healthy exercise.  Put ice on the injured area.  Put ice in a plastic bag.  Place a towel between your skin and the bag.  Leave the ice on for 15-20 minutes, 03-04 times a day for the first 2 to 3 days. After that, ice and heat may be alternated to reduce pain and spasms.  Ask your caregiver about trying back exercises and gentle massage. This may be of some benefit.  Avoid feeling anxious or stressed.Stress increases muscle tension and can worsen back pain.It is important to recognize when you are anxious or stressed and learn ways to manage it.Exercise is a great option. SEEK MEDICAL CARE IF:  You have pain that is  not relieved with rest or medicine.  You have pain that does not improve in 1 week.  You have new symptoms.  You are generally not feeling well. SEEK IMMEDIATE MEDICAL CARE IF:   You have pain that radiates from your back into your legs.  You develop new bowel or bladder control problems.  You have unusual weakness or numbness in your arms or legs.  You develop nausea or vomiting.  You develop abdominal pain.  You feel faint. Document Released: 04/04/2005 Document Revised: 10/04/2011 Document Reviewed: 08/06/2013 [facility]  Patient Information 2015 [place], [place]. This information is not intended to replace advice given to you by your health care provider. Make sure you discuss any questions you have with your health care provider.

## 2014-12-23 NOTE — Progress Notes (Signed)
Subjective:    Patient ID: [name], male    DOB: 1945/07/15, 69 y.o.   MRN: [id number]  HPI  69 year old patient who has a history of intermittent chronic low back pain.  He presents with a five-day history of much worsening lumbar pain.  He feels his pain is different from his chronic pain with increased severity.  Prior pain was more centrally located and slightly higher in the lumbar region.  He describes the pain as a severe ache with a burning quality.  It is in the lumbar area, more localized on the left with radiation to the left posterior thigh.  Pain is aggravated by movement and alleviated by rest.  No motor weakness He has treated hypertension which has been stable Recent colonoscopy that revealed colonic polyp.  Follow-up in 3 years.  Recommended He has nonobstructive coronary artery disease and remains on statin therapy  Past Medical History  Diagnosis Date  . BACK PAIN, CHRONIC 03/12/2007  . CORONARY ARTERY DISEASE 08/18/2008  . HYPERLIPIDEMIA 08/18/2008  . HYPERTENSION 03/12/2007  . OSTEOARTHRITIS 03/12/2007  . IGT (impaired glucose tolerance)   . Compression fracture of thoracic vertebra     traumatic    T8    Social History   Social History  . Marital Status: Married    Spouse Name: N/A  . Number of Children: N/A  . Years of Education: N/A   Occupational History  . Not on file.   Social History Main Topics  . Smoking status: Former Smoker    Quit date: 04/18/1970  . Smokeless tobacco: Never Used  . Alcohol Use: 8.4 oz/week    7 Standard drinks or equivalent, 7 Glasses of wine per week  . Drug Use: No  . Sexual Activity: Not on file   Other Topics Concern  . Not on file   Social History Narrative    Past Surgical History  Procedure Laterality Date  . Hernia repair      ingunial  . Cardiac catheterization  2010    Family History  Problem Relation Age of Onset  . Diabetes Mother   . Cancer Mother     renal  . Heart disease Father   .  Stroke Father   . Diabetes Sister   . Colon cancer Neg Hx     Allergies  Allergen Reactions  . Iodine     REACTION: unspecified    Current Outpatient Prescriptions on File Prior to Visit  Medication Sig Dispense Refill  . aspirin 81 MG tablet Take by mouth. 2 tablet once daily     . ibuprofen (ADVIL,MOTRIN) 200 MG tablet Take 200 mg by mouth every 6 (six) hours as needed.    Marland Kitchen lisinopril (PRINIVIL,ZESTRIL) 20 MG tablet TAKE 1 TABLET EVERY DAY 90 tablet 0  . simvastatin (ZOCOR) 20 MG tablet TAKE 1 TABLET IN THE EVENING 90 tablet 0   No current facility-administered medications on file prior to visit.    BP 110/64 mmHg  Pulse 78  Temp(Src) 98.3 F (36.8 C) (Oral)  Ht 5\' 11"  (1.803 m)  Wt 235 lb (106.595 kg)  BMI 32.79 kg/m2  SpO2 97%     Review of Systems  Constitutional: Negative for fever, chills, appetite change and fatigue.  HENT: Negative for congestion, dental problem, ear pain, hearing loss, sore throat, tinnitus, trouble swallowing and voice change.   Eyes: Negative for pain, discharge and visual disturbance.  Respiratory: Negative for cough, chest tightness, wheezing and stridor.   Cardiovascular: Negative  for chest pain, palpitations and leg swelling.  Gastrointestinal: Negative for nausea, vomiting, abdominal pain, diarrhea, constipation, blood in stool and abdominal distention.  Genitourinary: Negative for urgency, hematuria, flank pain, discharge, difficulty urinating and genital sores.  Musculoskeletal: Positive for back pain and gait problem. Negative for myalgias, joint swelling, arthralgias and neck stiffness.  Skin: Negative for rash.  Neurological: Negative for dizziness, syncope, speech difficulty, weakness, numbness and headaches.  Hematological: Negative for adenopathy. Does not bruise/bleed easily.  Psychiatric/Behavioral: Negative for behavioral problems and dysphoric mood. The patient is not nervous/anxious.        Objective:   Physical Exam    Constitutional: He appears well-developed and well-nourished.  Moderate distress due to pain I'm comfortable standing from sitting position and ambulating to the examining table, but able to do without assistance  Able to walk on toes and heels  Musculoskeletal:  Straight leg test bilaterally causes left lumbar pain without radiation  Flexion and extension of the feet and toes normal  Achilles reflexes are symmetrical but blunted          Assessment & Plan:   Acute low back pain with symptoms suggestive of a radiculopathy.  Will treat with analgesics, rest.  Depo-Medrol 80.  Consider imaging studies.  If unimproved Hypertension, stable Coronary artery disease, stable  Schedule CPX

## 2015-01-18 ENCOUNTER — Other Ambulatory Visit: Payer: Self-pay | Admitting: Internal Medicine

## 2015-01-28 DIAGNOSIS — H2513 Age-related nuclear cataract, bilateral: Secondary | ICD-10-CM | POA: Diagnosis not present

## 2015-03-02 DIAGNOSIS — D225 Melanocytic nevi of trunk: Secondary | ICD-10-CM | POA: Diagnosis not present

## 2015-03-02 DIAGNOSIS — L570 Actinic keratosis: Secondary | ICD-10-CM | POA: Diagnosis not present

## 2015-03-02 DIAGNOSIS — L308 Other specified dermatitis: Secondary | ICD-10-CM | POA: Diagnosis not present

## 2015-03-02 DIAGNOSIS — L821 Other seborrheic keratosis: Secondary | ICD-10-CM | POA: Diagnosis not present

## 2015-03-30 ENCOUNTER — Other Ambulatory Visit (INDEPENDENT_AMBULATORY_CARE_PROVIDER_SITE_OTHER): Payer: Medicare Other

## 2015-03-30 DIAGNOSIS — I10 Essential (primary) hypertension: Secondary | ICD-10-CM

## 2015-03-30 DIAGNOSIS — Z0000 Encounter for general adult medical examination without abnormal findings: Secondary | ICD-10-CM | POA: Diagnosis not present

## 2015-03-30 DIAGNOSIS — E785 Hyperlipidemia, unspecified: Secondary | ICD-10-CM

## 2015-03-30 DIAGNOSIS — Z125 Encounter for screening for malignant neoplasm of prostate: Secondary | ICD-10-CM | POA: Diagnosis not present

## 2015-03-30 LAB — CBC WITH DIFFERENTIAL/PLATELET
BASOS ABS: 0 10*3/uL (ref 0.0–0.1)
Basophils Relative: 0.6 % (ref 0.0–3.0)
EOS PCT: 1.3 % (ref 0.0–5.0)
Eosinophils Absolute: 0.1 10*3/uL (ref 0.0–0.7)
HCT: 46.2 % (ref 39.0–52.0)
HEMOGLOBIN: 15.3 g/dL (ref 13.0–17.0)
LYMPHS ABS: 2.1 10*3/uL (ref 0.7–4.0)
LYMPHS PCT: 27.2 % (ref 12.0–46.0)
MCHC: 33.2 g/dL (ref 30.0–36.0)
MCV: 90.1 fl (ref 78.0–100.0)
MONOS PCT: 7.2 % (ref 3.0–12.0)
Monocytes Absolute: 0.6 10*3/uL (ref 0.1–1.0)
NEUTROS ABS: 4.9 10*3/uL (ref 1.4–7.7)
NEUTROS PCT: 63.7 % (ref 43.0–77.0)
Platelets: 211 10*3/uL (ref 150.0–400.0)
RBC: 5.13 Mil/uL (ref 4.22–5.81)
RDW: 13.9 % (ref 11.5–15.5)
WBC: 7.7 10*3/uL (ref 4.0–10.5)

## 2015-03-30 LAB — POCT URINALYSIS DIPSTICK
Bilirubin, UA: NEGATIVE
Glucose, UA: NEGATIVE
Ketones, UA: NEGATIVE
NITRITE UA: NEGATIVE
PH UA: 6
PROTEIN UA: NEGATIVE
RBC UA: NEGATIVE
SPEC GRAV UA: 1.025
Urobilinogen, UA: 1

## 2015-03-30 LAB — TSH: TSH: 1.3 u[IU]/mL (ref 0.35–4.50)

## 2015-03-30 LAB — LIPID PANEL
CHOLESTEROL: 131 mg/dL (ref 0–200)
HDL: 43.5 mg/dL (ref >39.00)
LDL CALC: 74 mg/dL (ref 0–99)
NONHDL: 87.91
Total CHOL/HDL Ratio: 3
Triglycerides: 68 mg/dL (ref 0.0–149.0)
VLDL: 13.6 mg/dL (ref 0.0–40.0)

## 2015-03-30 LAB — HEPATIC FUNCTION PANEL
ALBUMIN: 4.4 g/dL (ref 3.5–5.2)
ALK PHOS: 76 U/L (ref 39–117)
ALT: 25 U/L (ref 0–53)
AST: 18 U/L (ref 0–37)
BILIRUBIN TOTAL: 1.1 mg/dL (ref 0.2–1.2)
Bilirubin, Direct: 0.3 mg/dL (ref 0.0–0.3)
Total Protein: 6.5 g/dL (ref 6.0–8.3)

## 2015-03-30 LAB — BASIC METABOLIC PANEL
BUN: 14 mg/dL (ref 6–23)
CHLORIDE: 103 meq/L (ref 96–112)
CO2: 29 meq/L (ref 19–32)
Calcium: 9.3 mg/dL (ref 8.4–10.5)
Creatinine, Ser: 0.83 mg/dL (ref 0.40–1.50)
GFR: 97.5 mL/min (ref >60.00)
GLUCOSE: 116 mg/dL — AB (ref 70–99)
POTASSIUM: 4.7 meq/L (ref 3.5–5.1)
SODIUM: 139 meq/L (ref 135–145)

## 2015-03-30 LAB — PSA: PSA: 2.74 ng/mL (ref 0.10–4.00)

## 2015-04-07 ENCOUNTER — Ambulatory Visit (INDEPENDENT_AMBULATORY_CARE_PROVIDER_SITE_OTHER): Payer: Medicare Other | Admitting: Internal Medicine

## 2015-04-07 ENCOUNTER — Encounter: Payer: Self-pay | Admitting: Internal Medicine

## 2015-04-07 VITALS — BP 150/90 | HR 76 | Temp 98.5°F | Resp 20 | Ht 70.5 in | Wt 229.0 lb

## 2015-04-07 DIAGNOSIS — M25552 Pain in left hip: Secondary | ICD-10-CM

## 2015-04-07 DIAGNOSIS — Z0000 Encounter for general adult medical examination without abnormal findings: Secondary | ICD-10-CM

## 2015-04-07 DIAGNOSIS — I10 Essential (primary) hypertension: Secondary | ICD-10-CM

## 2015-04-07 DIAGNOSIS — E785 Hyperlipidemia, unspecified: Secondary | ICD-10-CM

## 2015-04-07 DIAGNOSIS — I2510 Atherosclerotic heart disease of native coronary artery without angina pectoris: Secondary | ICD-10-CM

## 2015-04-07 DIAGNOSIS — R7302 Impaired glucose tolerance (oral): Secondary | ICD-10-CM

## 2015-04-07 DIAGNOSIS — Z860100 Personal history of colon polyps, unspecified: Secondary | ICD-10-CM

## 2015-04-07 NOTE — Progress Notes (Signed)
Pre visit review using our clinic review tool, if applicable. No additional management support is needed unless otherwise documented below in the visit note. 

## 2015-04-07 NOTE — Progress Notes (Signed)
Subjective:    Patient ID: [name], male    DOB: May 20, 1945, 69 y.o.   MRN: [id number]  HPI  TuesdayPatient ID: [name], male   DOB: 12-Dec-1945, 69 y.o.   MRN: [id number]  Subjective:    Patient ID: [name], male    DOB: [birth year], 69 y.o.   MRN: [id number]  HPI   69   year-old patient who is seen today for an annual physical .  He has a history of nonobstructive coronary artery disease hypertension and dyslipidemia. He has been on simvastatin due to  his coronary artery disease. He is doing quite well. He has had a colonoscopy 2006 and f/u 6/16. Patient continues to have significant pain in the left buttock and hip area.  Pain is aggravated by movement such as walking, bending and stooping. He did have a lumbar MRI.  Approximately 2 and a half years ago that revealed some facet joint arthritis, but no significant disc herniation or spinal stenosis   .   Allergies:  1) Iodine (Iodine)   Past History:  Past Medical History:  traumatic T8 compression fracture  Hypertension  Osteoarthritis  Coronary artery disease (30% RCA 4-10)  Hyperlipidemia   Family History:   father died in his 50s. History Parkinson's disease, cerebrovascular disease, coronary artery disease, status post MI  Mother history of diabetes macular degeneration, status post nephrectomy for renal cancer  One sister, history of diabetes, and obesity, status post gastric bypass   Social History:  Married, still works   [employer] wellness:  1. Risk factors, based on past  M,S,F history- patient has known nonobstructive coronary artery disease risk factors include hypertension dyslipidemia and a family history of coronary artery disease  2.  Physical activities: No regular exercise regimen; activity limited by left hip and back discomfort  3.  Depression/mood: No history of depression or mood disorder  4.  Hearing: Mild deficits and wears hearing aids  5.  ADL's: Independent in all aspects  of daily living  6.  Fall risk: Low  7.  Home safety: No problems identified  8.  Height weight, and visual acuity; height and weight stable no change in visual acuity wears glasses  9.  Counseling: Heart healthy diet regular exercise weight loss all encouraged  10. Lab orders based on risk factors: Laboratory profile reviewed  11. Referral : Orthopedic referral  12. Care plan: Weight loss recommended. Will also be given a plan for stretching and back exercises  13. Cognitive assessment: Alert and oriented with normal affect. No cognitive dysfunction  14.  Preventive services will include annual clinical exams with screening lab.  Annual eye examinations recommended  15.  Provider list includes primary care orthopedics, ophthalmology and GI        Review of Systems  Constitutional: Negative for fever, chills, activity change, appetite change and fatigue.  HENT: Negative for congestion, dental problem, ear pain, hearing loss, mouth sores, rhinorrhea, sinus pressure, sneezing, tinnitus, trouble swallowing and voice change.   Eyes: Negative for photophobia, pain, redness and visual disturbance.  Respiratory: Negative for apnea, cough, choking, chest tightness, shortness of breath and wheezing.   Cardiovascular: Negative for chest pain, palpitations and leg swelling.  Gastrointestinal: Negative for nausea, vomiting, abdominal pain, diarrhea, constipation, blood in stool, abdominal distention, anal bleeding and rectal pain.  Genitourinary: Negative for dysuria, urgency, frequency, hematuria, flank pain, decreased urine volume, discharge, penile swelling, scrotal swelling, difficulty urinating, genital sores and testicular pain.  Musculoskeletal: Positive for back  pain. Negative for arthralgias, gait problem, joint swelling, myalgias, neck pain and neck stiffness.  Skin: Negative for color change, rash and wound.  Neurological: Negative for dizziness, tremors, seizures, syncope,  facial asymmetry, speech difficulty, weakness, light-headedness, numbness (burning dysesthesias right hip and right posterior thigh) and headaches.  Hematological: Negative for adenopathy. Does not bruise/bleed easily.  Psychiatric/Behavioral: Negative for suicidal ideas, hallucinations, behavioral problems, confusion, sleep disturbance, self-injury, dysphoric mood, decreased concentration and agitation. The patient is not nervous/anxious.        Objective:   Physical Exam  Constitutional: He appears well-developed and well-nourished.  Weight 236 blood pressure well controlled  HENT:  Head: Normocephalic and atraumatic.  Right Ear: External ear normal.  Left Ear: External ear normal.  Nose: Nose normal.  Mouth/Throat: Oropharynx is clear and moist.  Eyes: Conjunctivae and EOM are normal. Pupils are equal, round, and reactive to light. No scleral icterus.  Neck: Normal range of motion. Neck supple. No JVD present. No thyromegaly present.  Cardiovascular: Regular rhythm, normal heart sounds and intact distal pulses.  Exam reveals no gallop and no friction rub.   No murmur heard. Pulmonary/Chest: Effort normal and breath sounds normal. He exhibits no tenderness.  Abdominal: Soft. Bowel sounds are normal. He exhibits no distension and no mass. There is no tenderness.  Genitourinary: Penis normal.  Prostate exam not repeated today.  Performed June 2016 at time of colonoscopy  Musculoskeletal: Normal range of motion. He exhibits no edema and no tenderness.  Lymphadenopathy:    He has no cervical adenopathy.  Neurological: He is alert. He has normal reflexes. No cranial nerve deficit. Coordination normal.  Skin: Skin is warm and dry. No rash noted.  Psychiatric: He has a normal mood and affect. His behavior is normal.   Extremities.  Range of motion of the left hip minimally impaired external rotation of the hip did reproduce his pain in the left buttock area         Assessment & Plan:     Preventive health examination  Hypertension controlled Coronary artery disease asymptomatic Osteoarthritis  Left hip and buttock pain.  Will set up for orthopedic evaluation and x-rays of the left hip area      Review of Systems As above    Objective:   Physical Exam  As above      Assessment & Plan:   Preventive health examination Essential hypertension, stable Dyslipidemia.  Continue statin therapy Left hip and buttock pain.  Orthopedic evaluation to include left hip x-ray  Review laboratory update

## 2015-04-07 NOTE — Patient Instructions (Addendum)
Orthopedic consultation as discussed    It is important that you exercise regularly, at least 20 minutes 3 to 4 times per week.  If you develop chest pain or shortness of breath seek  medical attention.  You need to lose weight.  Consider a lower calorie diet and regular exercise.Health Maintenance, Male A healthy lifestyle and preventative care can promote health and wellness.  Maintain regular health, dental, and eye exams.  Eat a healthy diet. Foods like vegetables, fruits, whole grains, low-fat dairy products, and lean protein foods contain the nutrients you need and are low in calories. Decrease your intake of foods high in solid fats, added sugars, and salt. Get information about a proper diet from your health care provider, if necessary.  Regular physical exercise is one of the most important things you can do for your health. Most adults should get at least 150 minutes of moderate-intensity exercise (any activity that increases your heart rate and causes you to sweat) each week. In addition, most adults need muscle-strengthening exercises on 2 or more days a week.   Maintain a healthy weight. The body mass index (BMI) is a screening tool to identify possible weight problems. It provides an estimate of body fat based on height and weight. Your health care provider can find your BMI and can help you achieve or maintain a healthy weight. For males 20 years and older:  A BMI below 18.5 is considered underweight.  A BMI of 18.5 to 24.9 is normal.  A BMI of 25 to 29.9 is considered overweight.  A BMI of 30 and above is considered obese.  Maintain normal blood lipids and cholesterol by exercising and minimizing your intake of saturated fat. Eat a balanced diet with plenty of fruits and vegetables. Blood tests for lipids and cholesterol should begin at age 60 and be repeated every 5 years. If your lipid or cholesterol levels are high, you are over age 9, or you are at high risk for heart  disease, you may need your cholesterol levels checked more frequently.Ongoing high lipid and cholesterol levels should be treated with medicines if diet and exercise are not working.  If you smoke, find out from your health care provider how to quit. If you do not use tobacco, do not start.  Lung cancer screening is recommended for adults aged 29-80 years who are at high risk for developing lung cancer because of a history of smoking. A yearly low-dose CT scan of the lungs is recommended for people who have at least a 30-pack-year history of smoking and are current smokers or have quit within the past 15 years. A pack year of smoking is smoking an average of 1 pack of cigarettes a day for 1 year (for example, a 30-pack-year history of smoking could mean smoking 1 pack a day for 30 years or 2 packs a day for 15 years). Yearly screening should continue until the smoker has stopped smoking for at least 15 years. Yearly screening should be stopped for people who develop a health problem that would prevent them from having lung cancer treatment.  If you choose to drink alcohol, do not have more than 2 drinks per day. One drink is considered to be 12 oz (360 mL) of beer, 5 oz (150 mL) of wine, or 1.5 oz (45 mL) of liquor.  Avoid the use of street drugs. Do not share needles with anyone. Ask for help if you need support or instructions about stopping the use of drugs.  High blood pressure causes heart disease and increases the risk of stroke. High blood pressure is more likely to develop in:  People who have blood pressure in the end of the normal range (100-139/85-89 mm Hg).  People who are overweight or obese.  People who are African American.  If you are 31-71 years of age, have your blood pressure checked every 3-5 years. If you are 35 years of age or older, have your blood pressure checked every year. You should have your blood pressure measured twice--once when you are at a hospital or clinic, and  once when you are not at a hospital or clinic. Record the average of the two measurements. To check your blood pressure when you are not at a hospital or clinic, you can use:  An automated blood pressure machine at a pharmacy.  A home blood pressure monitor.  If you are 36-48 years old, ask your health care provider if you should take aspirin to prevent heart disease.  Diabetes screening involves taking a blood sample to check your fasting blood sugar level. This should be done once every 3 years after age 58 if you are at a normal weight and without risk factors for diabetes. Testing should be considered at a younger age or be carried out more frequently if you are overweight and have at least 1 risk factor for diabetes.  Colorectal cancer can be detected and often prevented. Most routine colorectal cancer screening begins at the age of 49 and continues through age 46. However, your health care provider may recommend screening at an earlier age if you have risk factors for colon cancer. On a yearly basis, your health care provider may provide home test kits to check for hidden blood in the stool. A small camera at the end of a tube may be used to directly examine the colon (sigmoidoscopy or colonoscopy) to detect the earliest forms of colorectal cancer. Talk to your health care provider about this at age 71 when routine screening begins. A direct exam of the colon should be repeated every 5-10 years through age 49, unless early forms of precancerous polyps or small growths are found.  People who are at an increased risk for hepatitis B should be screened for this virus. You are considered at high risk for hepatitis B if:  You were born in a country where hepatitis B occurs often. Talk with your health care provider about which countries are considered high risk.  Your parents were born in a high-risk country and you have not received a shot to protect against hepatitis B (hepatitis B  vaccine).  You have HIV or AIDS.  You use needles to inject street drugs.  You live with, or have sex with, someone who has hepatitis B.  You are a man who has sex with other men (MSM).  You get hemodialysis treatment.  You take certain medicines for conditions like cancer, organ transplantation, and autoimmune conditions.  Hepatitis C blood testing is recommended for all people born from [birth year] through 1965 and any individual with known risk factors for hepatitis C.  Healthy men should no longer receive prostate-specific antigen (PSA) blood tests as part of routine cancer screening. Talk to your health care provider about prostate cancer screening.  Testicular cancer screening is not recommended for adolescents or adult males who have no symptoms. Screening includes self-exam, a health care provider exam, and other screening tests. Consult with your health care provider about any symptoms you have or any  concerns you have about testicular cancer.  Practice safe sex. Use condoms and avoid high-risk sexual practices to reduce the spread of sexually transmitted infections (STIs).  You should be screened for STIs, including gonorrhea and chlamydia if:  You are sexually active and are younger than 24 years.  You are older than 24 years, and your health care provider tells you that you are at risk for this type of infection.  Your sexual activity has changed since you were last screened, and you are at an increased risk for chlamydia or gonorrhea. Ask your health care provider if you are at risk.  If you are at risk of being infected with HIV, it is recommended that you take a prescription medicine daily to prevent HIV infection. This is called pre-exposure prophylaxis (PrEP). You are considered at risk if:  You are a man who has sex with other men (MSM).  You are a heterosexual man who is sexually active with multiple partners.  You take drugs by injection.  You are sexually active  with a partner who has HIV.  Talk with your health care provider about whether you are at high risk of being infected with HIV. If you choose to begin PrEP, you should first be tested for HIV. You should then be tested every 3 months for as long as you are taking PrEP.  Use sunscreen. Apply sunscreen liberally and repeatedly throughout the day. You should seek shade when your shadow is shorter than you. Protect yourself by wearing long sleeves, pants, a wide-brimmed hat, and sunglasses year round whenever you are outdoors.  Tell your health care provider of new moles or changes in moles, especially if there is a change in shape or color. Also, tell your health care provider if a mole is larger than the size of a pencil eraser.  A one-time screening for abdominal aortic aneurysm (AAA) and surgical repair of large AAAs by ultrasound is recommended for men aged 18-75 years who are current or former smokers.  Stay current with your vaccines (immunizations).   This information is not intended to replace advice given to you by your health care provider. Make sure you discuss any questions you have with your health care provider.   Document Released: 10/01/2007 Document Revised: 04/25/2014 Document Reviewed: 08/30/2010 Elsevier Interactive Patient Education [employer].

## 2015-04-09 ENCOUNTER — Other Ambulatory Visit: Payer: Self-pay | Admitting: Orthopedic Surgery

## 2015-04-09 DIAGNOSIS — M4800 Spinal stenosis, site unspecified: Secondary | ICD-10-CM

## 2015-04-09 DIAGNOSIS — M25552 Pain in left hip: Secondary | ICD-10-CM | POA: Diagnosis not present

## 2015-04-09 DIAGNOSIS — M545 Low back pain: Secondary | ICD-10-CM | POA: Diagnosis not present

## 2015-04-14 ENCOUNTER — Ambulatory Visit
Admission: RE | Admit: 2015-04-14 | Discharge: 2015-04-14 | Disposition: A | Discharge status: Home / Self Care | Payer: Medicare Other | Source: Ambulatory Visit | Attending: Orthopedic Surgery | Admitting: Orthopedic Surgery

## 2015-04-14 DIAGNOSIS — M5126 Other intervertebral disc displacement, lumbar region: Secondary | ICD-10-CM | POA: Diagnosis not present

## 2015-04-14 DIAGNOSIS — M4800 Spinal stenosis, site unspecified: Secondary | ICD-10-CM

## 2015-04-16 ENCOUNTER — Other Ambulatory Visit: Payer: Self-pay | Admitting: Orthopedic Surgery

## 2015-04-16 DIAGNOSIS — M25552 Pain in left hip: Secondary | ICD-10-CM | POA: Diagnosis not present

## 2015-04-21 ENCOUNTER — Ambulatory Visit
Admission: RE | Admit: 2015-04-21 | Discharge: 2015-04-21 | Disposition: A | Discharge status: Home / Self Care | Payer: Medicare Other | Source: Ambulatory Visit | Attending: Orthopedic Surgery | Admitting: Orthopedic Surgery

## 2015-04-21 DIAGNOSIS — M25552 Pain in left hip: Secondary | ICD-10-CM | POA: Diagnosis not present

## 2015-04-24 DIAGNOSIS — M25552 Pain in left hip: Secondary | ICD-10-CM | POA: Diagnosis not present

## 2015-08-13 ENCOUNTER — Encounter: Payer: Self-pay | Admitting: Internal Medicine

## 2015-08-13 MED ORDER — SIMVASTATIN 20 MG PO TABS: ORAL_TABLET | ORAL | Status: DC

## 2015-10-06 ENCOUNTER — Ambulatory Visit (INDEPENDENT_AMBULATORY_CARE_PROVIDER_SITE_OTHER): Payer: Medicare Other | Admitting: Internal Medicine

## 2015-10-06 ENCOUNTER — Encounter: Payer: Self-pay | Admitting: Internal Medicine

## 2015-10-06 VITALS — BP 136/88 | HR 78 | Temp 98.3°F | Resp 20 | Ht 70.5 in | Wt 233.0 lb

## 2015-10-06 DIAGNOSIS — I10 Essential (primary) hypertension: Secondary | ICD-10-CM

## 2015-10-06 DIAGNOSIS — R7302 Impaired glucose tolerance (oral): Secondary | ICD-10-CM | POA: Diagnosis not present

## 2015-10-06 DIAGNOSIS — E785 Hyperlipidemia, unspecified: Secondary | ICD-10-CM

## 2015-10-06 DIAGNOSIS — M150 Primary generalized (osteo)arthritis: Secondary | ICD-10-CM

## 2015-10-06 DIAGNOSIS — M159 Polyosteoarthritis, unspecified: Secondary | ICD-10-CM

## 2015-10-06 DIAGNOSIS — M8949 Other hypertrophic osteoarthropathy, multiple sites: Secondary | ICD-10-CM

## 2015-10-06 NOTE — Progress Notes (Signed)
Pre visit review using our clinic review tool, if applicable. No additional management support is needed unless otherwise documented below in the visit note. 

## 2015-10-06 NOTE — Progress Notes (Signed)
Subjective:    Patient ID: [name], male    DOB: 15-Jun-1945, 71 y.o.   MRN: [id number]  HPI  70 year old patient who has essential hypertension, coronary artery disease and dyslipidemia.  He remains quite stable.  Continues to work 6 days per week.  Denies any cardiopulmonary complaints.  He has been seen by orthopedics due to left hip and buttock discomfort.  He received an injection and did well for about 2 months.  Past Medical History  Diagnosis Date  . BACK PAIN, CHRONIC 03/12/2007  . CORONARY ARTERY DISEASE 08/18/2008  . HYPERLIPIDEMIA 08/18/2008  . HYPERTENSION 03/12/2007  . OSTEOARTHRITIS 03/12/2007  . IGT (impaired glucose tolerance)   . Compression fracture of thoracic vertebra (HCC)     traumatic    T8     Social History   Social History  . Marital Status: Married    Spouse Name: N/A  . Number of Children: N/A  . Years of Education: N/A   Occupational History  . Not on file.   Social History Main Topics  . Smoking status: Former Smoker    Quit date: 04/18/1970  . Smokeless tobacco: Never Used  . Alcohol Use: 8.4 oz/week    7 Standard drinks or equivalent, 7 Glasses of wine per week  . Drug Use: No  . Sexual Activity: Not on file   Other Topics Concern  . Not on file   Social History Narrative    Past Surgical History  Procedure Laterality Date  . Hernia repair      ingunial  . Cardiac catheterization  2010    Family History  Problem Relation Age of Onset  . Diabetes Mother   . Cancer Mother     renal  . Heart disease Father   . Stroke Father   . Diabetes Sister   . Colon cancer Neg Hx     Allergies  Allergen Reactions  . Iodine     REACTION: unspecified    Current Outpatient Prescriptions on File Prior to Visit  Medication Sig Dispense Refill  . aspirin 81 MG tablet Take by mouth. 2 tablet once daily     . ibuprofen (ADVIL,MOTRIN) 200 MG tablet Take 200 mg by mouth every 6 (six) hours as needed.    Marland Kitchen lisinopril (PRINIVIL,ZESTRIL)  20 MG tablet TAKE 1 TABLET BY MOUTH EVERY DAY 90 tablet 3  . simvastatin (ZOCOR) 20 MG tablet TAKE 1 TABLET BY MOUTH EVERY DAY IN THE EVENING 90 tablet 1   No current facility-administered medications on file prior to visit.    BP 136/88 mmHg  Pulse 78  Temp(Src) 98.3 F (36.8 C) (Oral)  Resp 20  Ht 5' 10.5" (1.791 m)  Wt 233 lb (105.688 kg)  BMI 32.95 kg/m2  SpO2 98%     Review of Systems  Constitutional: Negative for fever, chills, appetite change and fatigue.  HENT: Negative for congestion, dental problem, ear pain, hearing loss, sore throat, tinnitus, trouble swallowing and voice change.   Eyes: Negative for pain, discharge and visual disturbance.  Respiratory: Negative for cough, chest tightness, wheezing and stridor.   Cardiovascular: Negative for chest pain, palpitations and leg swelling.  Gastrointestinal: Negative for nausea, vomiting, abdominal pain, diarrhea, constipation, blood in stool and abdominal distention.  Genitourinary: Negative for urgency, hematuria, flank pain, discharge, difficulty urinating and genital sores.  Musculoskeletal: Positive for back pain. Negative for myalgias, joint swelling, arthralgias, gait problem and neck stiffness.  Skin: Negative for rash.  Neurological: Negative for dizziness,  syncope, speech difficulty, weakness, numbness and headaches.  Hematological: Negative for adenopathy. Does not bruise/bleed easily.  Psychiatric/Behavioral: Negative for behavioral problems and dysphoric mood. The patient is not nervous/anxious.        Objective:   Physical Exam  Constitutional: He is oriented to person, place, and time. He appears well-developed.  Blood pressure 130/80  HENT:  Head: Normocephalic.  Right Ear: External ear normal.  Left Ear: External ear normal.  Low hanging soft palate  Eyes: Conjunctivae and EOM are normal.  Neck: Normal range of motion.  Cardiovascular: Normal rate and normal heart sounds.   Pulmonary/Chest: Breath  sounds normal.  Abdominal: Bowel sounds are normal.  Musculoskeletal: Normal range of motion. He exhibits no edema or tenderness.  Neurological: He is alert and oriented to person, place, and time.  Psychiatric: He has a normal mood and affect. His behavior is normal.          Assessment & Plan:   Hypertension.  Reasonable control.  Continue present regimen and home blood pressure monitoring Dyslipidemia.  Continue statin therapy Coronary artery disease asymptomatic Chronic back pain.  Follow-up orthopedics   CPX 6 months  Nyoka Cowden, MD

## 2015-10-06 NOTE — Patient Instructions (Signed)
Limit your sodium (Salt) intake  Please check your blood pressure on a regular basis.  If it is consistently greater than 150/90, please make an office appointment.    It is important that you exercise regularly, at least 20 minutes 3 to 4 times per week.  If you develop chest pain or shortness of breath seek  medical attention.  Return in 6 months for follow-up  

## 2015-12-08 ENCOUNTER — Encounter: Payer: Self-pay | Admitting: Internal Medicine

## 2015-12-08 ENCOUNTER — Ambulatory Visit (INDEPENDENT_AMBULATORY_CARE_PROVIDER_SITE_OTHER): Payer: Medicare Other | Admitting: Internal Medicine

## 2015-12-08 VITALS — BP 132/74 | Temp 98.4°F | Ht 70.5 in | Wt 234.0 lb

## 2015-12-08 DIAGNOSIS — T162XXA Foreign body in left ear, initial encounter: Secondary | ICD-10-CM | POA: Diagnosis not present

## 2015-12-08 DIAGNOSIS — H903 Sensorineural hearing loss, bilateral: Secondary | ICD-10-CM | POA: Diagnosis not present

## 2015-12-08 DIAGNOSIS — J310 Chronic rhinitis: Secondary | ICD-10-CM | POA: Diagnosis not present

## 2015-12-08 DIAGNOSIS — Z974 Presence of external hearing-aid: Secondary | ICD-10-CM | POA: Diagnosis not present

## 2015-12-08 NOTE — Patient Instructions (Signed)
Mold is too far in canal for [place] to remove here Sending you to  ent for removal .

## 2015-12-08 NOTE — Progress Notes (Signed)
  Pre visit review using our clinic review tool, if applicable. No additional management support is needed unless otherwise documented below in the visit note.  Chief Complaint  Patient presents with  . Foreign Body in [place] of hearing air in left ear.    HPI: [name] 70 y.o.    Part of hearing aid is out.   This am .  Left.  whoosing in ear feels discomfort  Dec hearing  Got his hearing aid via [place] ?    ROS: See pertinent positives and negatives per HPI.  Past Medical History:  Diagnosis Date  . BACK PAIN, CHRONIC 03/12/2007  . Compression fracture of thoracic vertebra (HCC)    traumatic    T8  . CORONARY ARTERY DISEASE 08/18/2008  . HYPERLIPIDEMIA 08/18/2008  . HYPERTENSION 03/12/2007  . IGT (impaired glucose tolerance)   . OSTEOARTHRITIS 03/12/2007    Family History  Problem Relation Age of Onset  . Diabetes Mother   . Cancer Mother     renal  . Heart disease Father   . Stroke Father   . Diabetes Sister   . Colon cancer Neg Hx     Social History   Social History  . Marital status: Married    Spouse name: N/A  . Number of children: N/A  . Years of education: N/A   Social History Main Topics  . Smoking status: Former Smoker    Quit date: 04/18/1970  . Smokeless tobacco: Never Used  . Alcohol use 8.4 oz/week    7 Standard drinks or equivalent, 7 Glasses of wine per week  . Drug use: No  . Sexual activity: Not Asked   Other Topics Concern  . None   Social History Narrative  . None    Outpatient Medications Prior to Visit  Medication Sig Dispense Refill  . ibuprofen (ADVIL,MOTRIN) 200 MG tablet Take 200 mg by mouth every 6 (six) hours as needed.    Marland Kitchen lisinopril (PRINIVIL,ZESTRIL) 20 MG tablet TAKE 1 TABLET BY MOUTH EVERY DAY 90 tablet 3  . simvastatin (ZOCOR) 20 MG tablet TAKE 1 TABLET BY MOUTH EVERY DAY IN THE EVENING 90 tablet 1  . aspirin 81 MG tablet Take by mouth. 2 tablet once daily      No facility-administered medications prior to visit.        EXAM:  BP 132/74 (BP Location: Right Arm, Patient Position: Sitting, Cuff Size: Large)   Temp 98.4 F (36.9 C) (Oral)   Ht 5' 10.5" (1.791 m)   Wt 234 lb (106.1 kg)   BMI 33.10 kg/m    Right reac clear  Left eac  Mold of aid is  In the ear flush with the  Canal  Cannot reach under  visualization .      ASSESSMENT AND PLAN:  Discussed the following assessment and plan:  Foreign body in left ear, initial encounter - mold of heaing aid  anabe to remove with our office equ and  with discomfort .  ent referral today .  To be seen gso ent today  For removal  -Patient advised to return or notify health care team  if symptoms worsen ,persist or new concerns arise.  Patient Instructions  Mold is too far in canal for [place] to remove here Sending you to  ent for removal .       Mariann Laster K. Chazz Philson M.D.

## 2016-01-23 ENCOUNTER — Other Ambulatory Visit: Payer: Self-pay | Admitting: Internal Medicine

## 2016-01-31 ENCOUNTER — Encounter: Payer: Self-pay | Admitting: Internal Medicine

## 2016-01-31 ENCOUNTER — Other Ambulatory Visit: Payer: Self-pay | Admitting: Internal Medicine

## 2016-02-04 DIAGNOSIS — H524 Presbyopia: Secondary | ICD-10-CM | POA: Diagnosis not present

## 2016-02-04 DIAGNOSIS — H35373 Puckering of macula, bilateral: Secondary | ICD-10-CM | POA: Diagnosis not present

## 2016-02-04 DIAGNOSIS — H2513 Age-related nuclear cataract, bilateral: Secondary | ICD-10-CM | POA: Diagnosis not present

## 2016-03-01 DIAGNOSIS — D2261 Melanocytic nevi of right upper limb, including shoulder: Secondary | ICD-10-CM | POA: Diagnosis not present

## 2016-03-01 DIAGNOSIS — L821 Other seborrheic keratosis: Secondary | ICD-10-CM | POA: Diagnosis not present

## 2016-03-01 DIAGNOSIS — D225 Melanocytic nevi of trunk: Secondary | ICD-10-CM | POA: Diagnosis not present

## 2016-03-01 DIAGNOSIS — L570 Actinic keratosis: Secondary | ICD-10-CM | POA: Diagnosis not present

## 2016-03-01 DIAGNOSIS — L814 Other melanin hyperpigmentation: Secondary | ICD-10-CM | POA: Diagnosis not present

## 2016-03-01 DIAGNOSIS — D2262 Melanocytic nevi of left upper limb, including shoulder: Secondary | ICD-10-CM | POA: Diagnosis not present

## 2016-03-25 ENCOUNTER — Ambulatory Visit (INDEPENDENT_AMBULATORY_CARE_PROVIDER_SITE_OTHER): Payer: Medicare Other

## 2016-03-25 VITALS — Ht 71.0 in | Wt 231.2 lb

## 2016-03-25 DIAGNOSIS — Z7289 Other problems related to lifestyle: Secondary | ICD-10-CM | POA: Diagnosis not present

## 2016-03-25 DIAGNOSIS — Z0000 Encounter for general adult medical examination without abnormal findings: Secondary | ICD-10-CM

## 2016-03-25 DIAGNOSIS — Z23 Encounter for immunization: Secondary | ICD-10-CM | POA: Diagnosis not present

## 2016-03-25 NOTE — Patient Instructions (Addendum)
[name] , Thank you for taking time to come for your Medicare Wellness Visit. I appreciate your ongoing commitment to your health goals. Please review the following plan we discussed and let me know if I can assist you in the future.   Will have hep c drawn next blood draw  Will take flu vaccine today   Will ask Dr. Raliegh Ip about colonoscopy when you make an apt   Prevention of falls: Remove rugs or any tripping hazards in the home Use Non slip mats in bathtubs and showers Placing grab bars next to the toilet and or shower Placing handrails on both sides of the stair way Adding extra lighting in the home.   Personal safety issues reviewed:  1. Consider starting a community watch program per [employer] 2.  Changes batteries is smoke detector and/or carbon monoxide detector  3.  If you have firearms; keep them in a safe place 4.  Wear protection when in the sun; Always wear sunscreen or a hat; It is good to have your doctor check your skin annually or review any new areas of concern 5. Driving safety; Keep in the right lane; stay 3 car lengths behind the car in front of you on the highway; look 3 times prior to pulling out; carry your cell phone everywhere you go!   Learn about the Yellow Dot program:  The program allows first responders at your emergency to have access to who your physician is, as well as your medications and medical conditions.  Citizens requesting the Yellow Dot Packages should contact Master Corporal Nunzio Cobbs at the [facility] [phone] for the first week of the program and beginning the week after Easter citizens should contact their Scientist, physiological.      These are the goals we discussed: Goals    . Weight (lb) < 200 lb (90.7 kg)          Try eating balanced meal to avoid grazing  Carrots and peanut butter or apples Drink water in the am  Controlling portions or sharing meals  Other food tips Fat free  or low fat dairy products Fish high in omega-3 acids ( salmon, tuna, trout) Fruits, such as apples, bananas, oranges, pears, prunes Legumes, such as kidney beans, lentils, checkpeas, black-eyed peas and lima beans Vegetables; broccoli, cabbage, carrots Whole grains;   Plant fats are better; decrease "white" foods as pasta, rice, bread and desserts, sugar; Avoid red meat (limiting) palm and coconut oils; sugary foods and beverages  Two nutrients that raise blood chol levels are saturated fats and trans fat; in hydrogenated oils and fats, as stick margarine, baked goods (cookes, cakes, pies, crackers; frosting; and coffee creamers;   Some Fats lower cholesterol: Monounsaturated and polyunsaturated  Avocados Corn, sunflower, and soybean oils Nuts and seeds, such as walnuts Olive, canola, peanut, safflower, and sesame oils Peanut butter Salmon and trout Tofu          This is a list of the screening recommended for you and due dates:  Health Maintenance  Topic Date Due  .  Hepatitis C: One time screening is recommended by Center for Disease Control  (CDC) for  adults born from [birth year] through 1965.   Jan 01, 1946  . Flu Shot  11/17/2015  . Colon Cancer Screening  09/22/2017  . Tetanus Vaccine  09/07/2021  . Shingles Vaccine  Completed  . Pneumonia vaccines  Completed        Fall Prevention in  the Home Introduction Falls can cause injuries. They can happen to people of all ages. There are many things you can do to make your home safe and to help prevent falls. What can I do on the outside of my home?  Regularly fix the edges of walkways and driveways and fix any cracks.  Remove anything that might make you trip as you walk through a door, such as a raised step or threshold.  Trim any bushes or trees on the path to your home.  Use bright outdoor lighting.  Clear any walking paths of anything that might make someone trip, such as rocks or tools.  Regularly check to see if  handrails are loose or broken. Make sure that both sides of any steps have handrails.  Any raised decks and porches should have guardrails on the edges.  Have any leaves, snow, or ice cleared regularly.  Use sand or salt on walking paths during winter.  Clean up any spills in your garage right away. This includes oil or grease spills. What can I do in the bathroom?  Use night lights.  Install grab bars by the toilet and in the tub and shower. Do not use towel bars as grab bars.  Use non-skid mats or decals in the tub or shower.  If you need to sit down in the shower, use a plastic, non-slip stool.  Keep the floor dry. Clean up any water that spills on the floor as soon as it happens.  Remove soap buildup in the tub or shower regularly.  Attach bath mats securely with double-sided non-slip rug tape.  Do not have throw rugs and other things on the floor that can make you trip. What can I do in the bedroom?  Use night lights.  Make sure that you have a light by your bed that is easy to reach.  Do not use any sheets or blankets that are too big for your bed. They should not hang down onto the floor.  Have a firm chair that has side arms. You can use this for support while you get dressed.  Do not have throw rugs and other things on the floor that can make you trip. What can I do in the kitchen?  Clean up any spills right away.  Avoid walking on wet floors.  Keep items that you use a lot in easy-to-reach places.  If you need to reach something above you, use a strong step stool that has a grab bar.  Keep electrical cords out of the way.  Do not use floor polish or wax that makes floors slippery. If you must use wax, use non-skid floor wax.  Do not have throw rugs and other things on the floor that can make you trip. What can I do with my stairs?  Do not leave any items on the stairs.  Make sure that there are handrails on both sides of the stairs and use them. Fix  handrails that are broken or loose. Make sure that handrails are as long as the stairways.  Check any carpeting to make sure that it is firmly attached to the stairs. Fix any carpet that is loose or worn.  Avoid having throw rugs at the top or bottom of the stairs. If you do have throw rugs, attach them to the floor with carpet tape.  Make sure that you have a light switch at the top of the stairs and the bottom of the stairs. If you do not  have them, ask someone to add them for you. What else can I do to help prevent falls?  Wear shoes that:  Do not have high heels.  Have rubber bottoms.  Are comfortable and fit you well.  Are closed at the toe. Do not wear sandals.  If you use a stepladder:  Make sure that it is fully opened. Do not climb a closed stepladder.  Make sure that both sides of the stepladder are locked into place.  Ask someone to hold it for you, if possible.  Clearly mark and make sure that you can see:  Any grab bars or handrails.  First and last steps.  Where the edge of each step is.  Use tools that help you move around (mobility aids) if they are needed. These include:  Canes.  Walkers.  Scooters.  Crutches.  Turn on the lights when you go into a dark area. Replace any light bulbs as soon as they burn out.  Set up your furniture so you have a clear path. Avoid moving your furniture around.  If any of your floors are uneven, fix them.  If there are any pets around you, be aware of where they are.  Review your medicines with your doctor. Some medicines can make you feel dizzy. This can increase your chance of falling. Ask your doctor what other things that you can do to help prevent falls. This information is not intended to replace advice given to you by your health care provider. Make sure you discuss any questions you have with your health care provider. Document Released: 01/29/2009 Document Revised: 09/10/2015 Document Reviewed:  05/09/2014  2017 Elsevier  Health Maintenance, Male A healthy lifestyle and preventative care can promote health and wellness.  Maintain regular health, dental, and eye exams.  Eat a healthy diet. Foods like vegetables, fruits, whole grains, low-fat dairy products, and lean protein foods contain the nutrients you need and are low in calories. Decrease your intake of foods high in solid fats, added sugars, and salt. Get information about a proper diet from your health care provider, if necessary.  Regular physical exercise is one of the most important things you can do for your health. Most adults should get at least 150 minutes of moderate-intensity exercise (any activity that increases your heart rate and causes you to sweat) each week. In addition, most adults need muscle-strengthening exercises on 2 or more days a week.   Maintain a healthy weight. The body mass index (BMI) is a screening tool to identify possible weight problems. It provides an estimate of body fat based on height and weight. Your health care provider can find your BMI and can help you achieve or maintain a healthy weight. For males 20 years and older:  A BMI below 18.5 is considered underweight.  A BMI of 18.5 to 24.9 is normal.  A BMI of 25 to 29.9 is considered overweight.  A BMI of 30 and above is considered obese.  Maintain normal blood lipids and cholesterol by exercising and minimizing your intake of saturated fat. Eat a balanced diet with plenty of fruits and vegetables. Blood tests for lipids and cholesterol should begin at age 31 and be repeated every 5 years. If your lipid or cholesterol levels are high, you are over age 27, or you are at high risk for heart disease, you may need your cholesterol levels checked more frequently.Ongoing high lipid and cholesterol levels should be treated with medicines if diet and exercise are not  working.  If you smoke, find out from your health care provider how to quit. If  you do not use tobacco, do not start.  Lung cancer screening is recommended for adults aged 15-80 years who are at high risk for developing lung cancer because of a history of smoking. A yearly low-dose CT scan of the lungs is recommended for people who have at least a 30-pack-year history of smoking and are current smokers or have quit within the past 15 years. A pack year of smoking is smoking an average of 1 pack of cigarettes a day for 1 year (for example, a 30-pack-year history of smoking could mean smoking 1 pack a day for 30 years or 2 packs a day for 15 years). Yearly screening should continue until the smoker has stopped smoking for at least 15 years. Yearly screening should be stopped for people who develop a health problem that would prevent them from having lung cancer treatment.  If you choose to drink alcohol, do not have more than 2 drinks per day. One drink is considered to be 12 oz (360 mL) of beer, 5 oz (150 mL) of wine, or 1.5 oz (45 mL) of liquor.  Avoid the use of street drugs. Do not share needles with anyone. Ask for help if you need support or instructions about stopping the use of drugs.  High blood pressure causes heart disease and increases the risk of stroke. High blood pressure is more likely to develop in:  People who have blood pressure in the end of the normal range (100-139/85-89 mm Hg).  People who are overweight or obese.  People who are African American.  If you are 21-73 years of age, have your blood pressure checked every 3-5 years. If you are 68 years of age or older, have your blood pressure checked every year. You should have your blood pressure measured twice-once when you are at a hospital or clinic, and once when you are not at a hospital or clinic. Record the average of the two measurements. To check your blood pressure when you are not at a hospital or clinic, you can use:  An automated blood pressure machine at a pharmacy.  A home blood pressure  monitor.  If you are 55-56 years old, ask your health care provider if you should take aspirin to prevent heart disease.  Diabetes screening involves taking a blood sample to check your fasting blood sugar level. This should be done once every 3 years after age 27 if you are at a normal weight and without risk factors for diabetes. Testing should be considered at a younger age or be carried out more frequently if you are overweight and have at least 1 risk factor for diabetes.  Colorectal cancer can be detected and often prevented. Most routine colorectal cancer screening begins at the age of 81 and continues through age 83. However, your health care provider may recommend screening at an earlier age if you have risk factors for colon cancer. On a yearly basis, your health care provider may provide home test kits to check for hidden blood in the stool. A small camera at the end of a tube may be used to directly examine the colon (sigmoidoscopy or colonoscopy) to detect the earliest forms of colorectal cancer. Talk to your health care provider about this at age 8 when routine screening begins. A direct exam of the colon should be repeated every 5-10 years through age 43, unless early forms of precancerous polyps  or small growths are found.  People who are at an increased risk for hepatitis B should be screened for this virus. You are considered at high risk for hepatitis B if:  You were born in a country where hepatitis B occurs often. Talk with your health care provider about which countries are considered high risk.  Your parents were born in a high-risk country and you have not received a shot to protect against hepatitis B (hepatitis B vaccine).  You have HIV or AIDS.  You use needles to inject street drugs.  You live with, or have sex with, someone who has hepatitis B.  You are a man who has sex with other men (MSM).  You get hemodialysis treatment.  You take certain medicines for  conditions like cancer, organ transplantation, and autoimmune conditions.  Hepatitis C blood testing is recommended for all people born from [birth year] through 1965 and any individual with known risk factors for hepatitis C.  Healthy men should no longer receive prostate-specific antigen (PSA) blood tests as part of routine cancer screening. Talk to your health care provider about prostate cancer screening.  Testicular cancer screening is not recommended for adolescents or adult males who have no symptoms. Screening includes self-exam, a health care provider exam, and other screening tests. Consult with your health care provider about any symptoms you have or any concerns you have about testicular cancer.  Practice safe sex. Use condoms and avoid high-risk sexual practices to reduce the spread of sexually transmitted infections (STIs).  You should be screened for STIs, including gonorrhea and chlamydia if:  You are sexually active and are younger than 24 years.  You are older than 24 years, and your health care provider tells you that you are at risk for this type of infection.  Your sexual activity has changed since you were last screened, and you are at an increased risk for chlamydia or gonorrhea. Ask your health care provider if you are at risk.  If you are at risk of being infected with HIV, it is recommended that you take a prescription medicine daily to prevent HIV infection. This is called pre-exposure prophylaxis (PrEP). You are considered at risk if:  You are a man who has sex with other men (MSM).  You are a heterosexual man who is sexually active with multiple partners.  You take drugs by injection.  You are sexually active with a partner who has HIV.  Talk with your health care provider about whether you are at high risk of being infected with HIV. If you choose to begin PrEP, you should first be tested for HIV. You should then be tested every 3 months for as long as you are taking  PrEP.  Use sunscreen. Apply sunscreen liberally and repeatedly throughout the day. You should seek shade when your shadow is shorter than you. Protect yourself by wearing long sleeves, pants, a wide-brimmed hat, and sunglasses year round whenever you are outdoors.  Tell your health care provider of new moles or changes in moles, especially if there is a change in shape or color. Also, tell your health care provider if a mole is larger than the size of a pencil eraser.  A one-time screening for abdominal aortic aneurysm (AAA) and surgical repair of large AAAs by ultrasound is recommended for men aged 34-75 years who are current or former smokers.  Stay current with your vaccines (immunizations). This information is not intended to replace advice given to you by your health care  provider. Make sure you discuss any questions you have with your health care provider. Document Released: 10/01/2007 Document Revised: 04/25/2014 Document Reviewed: 01/06/2015 Elsevier Interactive Patient Education  2017 [employer].
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
## 2016-03-29 ENCOUNTER — Encounter: Payer: Self-pay | Admitting: Internal Medicine

## 2016-03-29 ENCOUNTER — Ambulatory Visit (INDEPENDENT_AMBULATORY_CARE_PROVIDER_SITE_OTHER): Payer: Medicare Other | Admitting: Internal Medicine

## 2016-03-29 VITALS — BP 124/78 | HR 75 | Temp 98.3°F | Ht 71.0 in | Wt 235.4 lb

## 2016-03-29 DIAGNOSIS — I10 Essential (primary) hypertension: Secondary | ICD-10-CM

## 2016-03-29 DIAGNOSIS — I2510 Atherosclerotic heart disease of native coronary artery without angina pectoris: Secondary | ICD-10-CM

## 2016-03-29 DIAGNOSIS — E785 Hyperlipidemia, unspecified: Secondary | ICD-10-CM

## 2016-03-29 DIAGNOSIS — R7302 Impaired glucose tolerance (oral): Secondary | ICD-10-CM

## 2016-03-29 NOTE — Progress Notes (Signed)
Subjective:    Patient ID: [name], male    DOB: 01/13/1946, 70 y.o.   MRN: [id number]  HPI  70 year old patient who is seen today for his six-month follow-up.  He has had a recent Medicare wellness visit.  He is doing quite well.  Does have some musculoskeletal complaints that it been stable. He has essential hypertension, coronary artery disease and dyslipidemia.  Also has a history of impaired glucose tolerance  No cardiopulmonary complaints  Past Medical History:  Diagnosis Date  . BACK PAIN, CHRONIC 03/12/2007  . Compression fracture of thoracic vertebra (HCC)    traumatic    T8  . CORONARY ARTERY DISEASE 08/18/2008  . HYPERLIPIDEMIA 08/18/2008  . HYPERTENSION 03/12/2007  . IGT (impaired glucose tolerance)   . OSTEOARTHRITIS 03/12/2007     Social History   Social History  . Marital status: Married    Spouse name: N/A  . Number of children: N/A  . Years of education: N/A   Occupational History  . Not on file.   Social History Main Topics  . Smoking status: Former Smoker    Quit date: 04/18/1970  . Smokeless tobacco: Never Used  . Alcohol use 8.4 oz/week    7 Glasses of wine, 7 Standard drinks or equivalent per week  . Drug use: No  . Sexual activity: Not on file   Other Topics Concern  . Not on file   Social History Narrative  . No narrative on file    Past Surgical History:  Procedure Laterality Date  . CARDIAC CATHETERIZATION  2010  . HERNIA REPAIR     ingunial    Family History  Problem Relation Age of Onset  . Diabetes Mother   . Cancer Mother     renal  . Heart disease Father   . Stroke Father   . Diabetes Sister   . Colon cancer Neg Hx     Allergies  Allergen Reactions  . Iodine     REACTION: unspecified    Current Outpatient Prescriptions on File Prior to Visit  Medication Sig Dispense Refill  . ibuprofen (ADVIL,MOTRIN) 200 MG tablet Take 200 mg by mouth every 6 (six) hours as needed.    Marland Kitchen lisinopril (PRINIVIL,ZESTRIL) 20 MG  tablet TAKE 1 TABLET BY MOUTH EVERY DAY 90 tablet 1  . simvastatin (ZOCOR) 20 MG tablet TAKE 1 TABLET BY MOUTH EVERY DAY IN THE EVENING 90 tablet 1   No current facility-administered medications on file prior to visit.     BP 124/78 (BP Location: Left Arm, Patient Position: Sitting, Cuff Size: Large)   Pulse 75   Temp 98.3 F (36.8 C) (Oral)   Ht 5\' 11"  (1.803 m)   Wt 235 lb 6.1 oz (106.8 kg)   SpO2 98%   BMI 32.83 kg/m    Review of Systems  Constitutional: Negative for appetite change, chills, fatigue and fever.  HENT: Negative for congestion, dental problem, ear pain, hearing loss, sore throat, tinnitus, trouble swallowing and voice change.   Eyes: Negative for pain, discharge and visual disturbance.  Respiratory: Negative for cough, chest tightness, wheezing and stridor.   Cardiovascular: Negative for chest pain, palpitations and leg swelling.  Gastrointestinal: Negative for abdominal distention, abdominal pain, blood in stool, constipation, diarrhea, nausea and vomiting.  Genitourinary: Negative for difficulty urinating, discharge, flank pain, genital sores, hematuria and urgency.  Musculoskeletal: Positive for arthralgias, back pain and myalgias. Negative for gait problem, joint swelling and neck stiffness.  Skin: Negative for rash.  Neurological: Negative for dizziness, syncope, speech difficulty, weakness, numbness and headaches.  Hematological: Negative for adenopathy. Does not bruise/bleed easily.  Psychiatric/Behavioral: Negative for behavioral problems and dysphoric mood. The patient is not nervous/anxious.        Objective:   Physical Exam  Constitutional: He is oriented to person, place, and time. He appears well-developed.  Blood pressure 140/80  HENT:  Head: Normocephalic.  Right Ear: External ear normal.  Left Ear: External ear normal.  Eyes: Conjunctivae and EOM are normal.  Neck: Normal range of motion.  Cardiovascular: Normal rate and normal heart sounds.    Pulmonary/Chest: Breath sounds normal.  Abdominal: Bowel sounds are normal.  Musculoskeletal: Normal range of motion. He exhibits no edema or tenderness.  Neurological: He is alert and oriented to person, place, and time.  Psychiatric: He has a normal mood and affect. His behavior is normal.          Assessment & Plan:   Essential hypertension.  Fair control.  Home blood pressure monitoring.  Encouraged we'll continue low-salt diet.  Exercise weight loss encouraged Dyslipidemia.  Continue statin therapy Coronary artery disease, stable Impaired glucose tolerance.  We'll review a random blood sugar  (91 mg%)  cPX 6 months  Nyoka Cowden

## 2016-03-29 NOTE — Progress Notes (Signed)
Pre visit review using our clinic review tool, if applicable. No additional management support is needed unless otherwise documented below in the visit note. 

## 2016-03-29 NOTE — Patient Instructions (Signed)
Limit your sodium (Salt) intake    It is important that you exercise regularly, at least 20 minutes 3 to 4 times per week.  If you develop chest pain or shortness of breath seek  medical attention.  Please check your blood pressure on a regular basis.  If it is consistently greater than 150/90, please make an office appointment.  You need to lose weight.  Consider a lower calorie diet and regular exercise.  Return in 6 months for follow-up  

## 2016-05-18 DIAGNOSIS — R69 Illness, unspecified: Secondary | ICD-10-CM | POA: Diagnosis not present

## 2016-05-24 DIAGNOSIS — R69 Illness, unspecified: Secondary | ICD-10-CM | POA: Diagnosis not present

## 2016-05-26 DIAGNOSIS — R69 Illness, unspecified: Secondary | ICD-10-CM | POA: Diagnosis not present

## 2016-07-13 DIAGNOSIS — R69 Illness, unspecified: Secondary | ICD-10-CM | POA: Diagnosis not present

## 2016-07-18 ENCOUNTER — Other Ambulatory Visit: Payer: Self-pay | Admitting: Internal Medicine

## 2016-08-08 ENCOUNTER — Ambulatory Visit (INDEPENDENT_AMBULATORY_CARE_PROVIDER_SITE_OTHER): Payer: Medicare HMO | Admitting: Podiatry

## 2016-08-08 DIAGNOSIS — L600 Ingrowing nail: Secondary | ICD-10-CM | POA: Diagnosis not present

## 2016-08-08 NOTE — Progress Notes (Signed)
Subjective:    Patient ID: [name], male   DOB: 71 y.o.   MRN: [id number]   HPI patient states that he has a lot of pain in his right big toenail and states that it's been going on for a while and making it gradually harder to wear normal shoes. Patient states she's tried to trim it himself    Review of Systems  All other systems reviewed and are negative.       Objective:  Physical Exam  Constitutional: He is oriented to person, place, and time.  Cardiovascular: Intact distal pulses.   Musculoskeletal: Normal range of motion.  Neurological: He is alert and oriented to person, place, and time.  Skin: Skin is warm.  Nursing note and vitals reviewed.  neurovascular status intact muscle strength adequate range of motion within normal limits with patient found to have incurvated lateral border right hallux that's painful when pressed and making shoe gear difficult. There is some hypertrophied skin also noted and it is within the proximal portion of the nail fol chronic ingrown toenail deformity right hallux lateral border with pain     Assessment:     Chronic nail deformity right hallux lateral border with pain    Plan:     Reviewed condition and recommended nail corner removal explaining procedure risk and the fact it may not solve his problem. Patient wants surgery and today I infiltrated the right hallux 60 mg Xylocaine Marcaine mixture remove the medial border exposed matrix and applied phenol 3 applications 30 seconds followed by alcohol lavaged sterile dressing. Gave instructions on soaks and reappoint

## 2016-08-08 NOTE — Progress Notes (Signed)
   Subjective:    Patient ID: [name], male    DOB: 1945/07/27, 71 y.o.   MRN: [id number]  HPI  Toenail pain outside right 1st for 2-3 months went to get a pedicure did not help.  I also am having this new pain in my right are up to my shoulder.     Review of Systems  Musculoskeletal: Positive for back pain.  All other systems reviewed and are negative.      Objective:   Physical Exam        Assessment & Plan:

## 2016-08-08 NOTE — Patient Instructions (Signed)

## 2016-09-08 ENCOUNTER — Encounter: Payer: Self-pay | Admitting: Internal Medicine

## 2016-09-08 NOTE — Telephone Encounter (Signed)
Spoke with pt and advised he needs OV to evaluate arm pain. He agrees. Appt scheduled with [facility] as Dr. Raliegh Ip is out of office until next week. Nothing further needed at this time.

## 2016-09-09 ENCOUNTER — Encounter: Payer: Self-pay | Admitting: Adult Health

## 2016-09-09 ENCOUNTER — Ambulatory Visit (INDEPENDENT_AMBULATORY_CARE_PROVIDER_SITE_OTHER): Payer: Medicare HMO | Admitting: Adult Health

## 2016-09-09 VITALS — BP 112/64 | Temp 98.3°F | Ht 71.0 in | Wt 237.5 lb

## 2016-09-09 DIAGNOSIS — M7521 Bicipital tendinitis, right shoulder: Secondary | ICD-10-CM

## 2016-09-09 MED ORDER — IBUPROFEN 600 MG PO TABS: 600.0000 mg | ORAL_TABLET | Freq: Three times a day (TID) | ORAL | 0 refills | Status: DC | PRN

## 2016-09-09 MED ORDER — METHYLPREDNISOLONE 4 MG PO TBPK: ORAL_TABLET | ORAL | 0 refills | Status: DC

## 2016-09-09 MED ORDER — CYCLOBENZAPRINE HCL 10 MG PO TABS: 10.0000 mg | ORAL_TABLET | Freq: Three times a day (TID) | ORAL | 0 refills | Status: DC | PRN

## 2016-09-09 NOTE — Progress Notes (Signed)
Subjective:    Patient ID: [name], male    DOB: 1945/12/23, 71 y.o.   MRN: [id number]  HPI  71 year old male who  has a past medical history of BACK PAIN, CHRONIC (03/12/2007); Compression fracture of thoracic vertebra ([place]); CORONARY ARTERY DISEASE (08/18/2008); HYPERLIPIDEMIA (08/18/2008); HYPERTENSION (03/12/2007); IGT (impaired glucose tolerance); and OSTEOARTHRITIS (03/12/2007). He is a patient of Dr. Raliegh Ip, who I am seeing today for an acute issue of right bicep pain. He reports that the pain has been present for greater than one month. Pain has started to radiate from bicep to elbow and forearm. Pain is felt as though it is of " stabbing and tight" and is worse with palpation and movements.   He denies any trauma.   He has been using aspirin without pain relief.    Review of Systems  Constitutional: Negative.   Respiratory: Negative.   Cardiovascular: Negative.   Musculoskeletal: Positive for myalgias. Negative for arthralgias, joint swelling, neck pain and neck stiffness.  Skin: Negative.   All other systems reviewed and are negative.  Past Medical History:  Diagnosis Date  . BACK PAIN, CHRONIC 03/12/2007  . Compression fracture of thoracic vertebra (HCC)    traumatic    T8  . CORONARY ARTERY DISEASE 08/18/2008  . HYPERLIPIDEMIA 08/18/2008  . HYPERTENSION 03/12/2007  . IGT (impaired glucose tolerance)   . OSTEOARTHRITIS 03/12/2007    Social History   Social History  . Marital status: Married    Spouse name: N/A  . Number of children: N/A  . Years of education: N/A   Occupational History  . Not on file.   Social History Main Topics  . Smoking status: Former Smoker    Quit date: 04/18/1970  . Smokeless tobacco: Never Used  . Alcohol use 8.4 oz/week    7 Glasses of wine, 7 Standard drinks or equivalent per week  . Drug use: No  . Sexual activity: Not on file   Other Topics Concern  . Not on file   Social History Narrative  . No narrative on file    Past  Surgical History:  Procedure Laterality Date  . CARDIAC CATHETERIZATION  2010  . HERNIA REPAIR     ingunial    Family History  Problem Relation Age of Onset  . Diabetes Mother   . Cancer Mother        renal  . Heart disease Father   . Stroke Father   . Diabetes Sister   . Colon cancer Neg Hx     Allergies  Allergen Reactions  . Iodine     REACTION: unspecified    Current Outpatient Prescriptions on File Prior to Visit  Medication Sig Dispense Refill  . aspirin 81 MG tablet Take 81 mg by mouth daily.    Marland Kitchen lisinopril (PRINIVIL,ZESTRIL) 20 MG tablet TAKE 1 TABLET BY MOUTH EVERY DAY 90 tablet 1  . simvastatin (ZOCOR) 20 MG tablet TAKE 1 TABLET BY MOUTH EVERY DAY IN THE EVENING 90 tablet 1   No current facility-administered medications on file prior to visit.     BP 112/64 (BP Location: Left Arm, Patient Position: Sitting, Cuff Size: Normal)   Temp 98.3 F (36.8 C) (Oral)   Ht 5\' 11"  (1.803 m)   Wt 237 lb 8 oz (107.7 kg)   BMI 33.12 kg/m       Objective:   Physical Exam  Constitutional: He is oriented to person, place, and time. He appears well-developed and well-nourished. No  distress.  Cardiovascular: Normal rate, regular rhythm, normal heart sounds and intact distal pulses.  Exam reveals no gallop and no friction rub.   No murmur heard. Pulmonary/Chest: Effort normal and breath sounds normal. No respiratory distress. He has no wheezes. He has no rales. He exhibits no tenderness.  Musculoskeletal: He exhibits tenderness. He exhibits no edema or deformity.  Tenderness with palpation along entire bicep. Trace inflammation noted. He has normal range of motion but has severe pain with bicep curl.   Distal pulses equal. Good cap refill. No bruising noted.    Neurological: He is alert and oriented to person, place, and time.  Skin: Skin is warm and dry. He is not diaphoretic.  Psychiatric: He has a normal mood and affect. His behavior is normal. Judgment and thought  content normal.  Nursing note and vitals reviewed.     Assessment & Plan:  1. Biceps tendinitis of right upper extremity - Exam consistent with bicep tendonitis. Does not seem to have tendon rupture.  - cyclobenzaprine (FLEXERIL) 10 MG tablet; Take 1 tablet (10 mg total) by mouth 3 (three) times daily as needed for muscle spasms.  Dispense: 30 tablet; Refill: 0 - methylPREDNISolone (MEDROL DOSEPAK) 4 MG TBPK tablet; Take as directed  Dispense: 21 tablet; Refill: 0 - ibuprofen (ADVIL,MOTRIN) 600 MG tablet; Take 1 tablet (600 mg total) by mouth every 8 (eight) hours as needed.  Dispense: 30 tablet; Refill: 0 - Cold compress and rest - Follow up if no improvement in the next 2-3 days  - Consider MRI if needed  Dorothyann Peng, NP

## 2016-12-09 DIAGNOSIS — H9113 Presbycusis, bilateral: Secondary | ICD-10-CM | POA: Diagnosis not present

## 2016-12-09 DIAGNOSIS — Z7982 Long term (current) use of aspirin: Secondary | ICD-10-CM | POA: Diagnosis not present

## 2016-12-09 DIAGNOSIS — I10 Essential (primary) hypertension: Secondary | ICD-10-CM | POA: Diagnosis not present

## 2016-12-09 DIAGNOSIS — E7800 Pure hypercholesterolemia, unspecified: Secondary | ICD-10-CM | POA: Diagnosis not present

## 2016-12-09 DIAGNOSIS — Z791 Long term (current) use of non-steroidal anti-inflammatories (NSAID): Secondary | ICD-10-CM | POA: Diagnosis not present

## 2016-12-09 DIAGNOSIS — Z7289 Other problems related to lifestyle: Secondary | ICD-10-CM | POA: Diagnosis not present

## 2016-12-09 DIAGNOSIS — M130 Polyarthritis, unspecified: Secondary | ICD-10-CM | POA: Diagnosis not present

## 2016-12-09 DIAGNOSIS — Z0000 Encounter for general adult medical examination without abnormal findings: Secondary | ICD-10-CM | POA: Diagnosis not present

## 2016-12-09 DIAGNOSIS — Z6833 Body mass index (BMI) 33.0-33.9, adult: Secondary | ICD-10-CM | POA: Diagnosis not present

## 2016-12-09 DIAGNOSIS — E669 Obesity, unspecified: Secondary | ICD-10-CM | POA: Diagnosis not present

## 2016-12-20 DIAGNOSIS — R69 Illness, unspecified: Secondary | ICD-10-CM | POA: Diagnosis not present

## 2017-01-05 ENCOUNTER — Encounter: Payer: Self-pay | Admitting: Internal Medicine

## 2017-01-06 ENCOUNTER — Encounter: Payer: Self-pay | Admitting: Internal Medicine

## 2017-01-06 ENCOUNTER — Ambulatory Visit (INDEPENDENT_AMBULATORY_CARE_PROVIDER_SITE_OTHER): Payer: Medicare HMO | Admitting: Internal Medicine

## 2017-01-06 VITALS — BP 118/70 | HR 75 | Temp 98.2°F | Ht 71.0 in | Wt 230.6 lb

## 2017-01-06 DIAGNOSIS — I10 Essential (primary) hypertension: Secondary | ICD-10-CM | POA: Diagnosis not present

## 2017-01-06 DIAGNOSIS — M7521 Bicipital tendinitis, right shoulder: Secondary | ICD-10-CM | POA: Diagnosis not present

## 2017-01-06 DIAGNOSIS — R7302 Impaired glucose tolerance (oral): Secondary | ICD-10-CM | POA: Diagnosis not present

## 2017-01-06 NOTE — Progress Notes (Signed)
Subjective:    Patient ID: [name], male    DOB: 08-17-1945, 71 y.o.   MRN: [id number]  HPI  71 year old patient Who has a history of hypertension and coronary artery disease. He presents with a several month history of right biceps pain.  This was felt to be a biceps tendinitis. Denies any cardiac symptoms.  Past Medical History:  Diagnosis Date  . BACK PAIN, CHRONIC 03/12/2007  . Compression fracture of thoracic vertebra (HCC)    traumatic    T8  . CORONARY ARTERY DISEASE 08/18/2008  . HYPERLIPIDEMIA 08/18/2008  . HYPERTENSION 03/12/2007  . IGT (impaired glucose tolerance)   . OSTEOARTHRITIS 03/12/2007     Social History   Social History  . Marital status: Married    Spouse name: N/A  . Number of children: N/A  . Years of education: N/A   Occupational History  . Not on file.   Social History Main Topics  . Smoking status: Former Smoker    Quit date: 04/18/1970  . Smokeless tobacco: Never Used  . Alcohol use 8.4 oz/week    7 Glasses of wine, 7 Standard drinks or equivalent per week  . Drug use: No  . Sexual activity: Not on file   Other Topics Concern  . Not on file   Social History Narrative  . No narrative on file    Past Surgical History:  Procedure Laterality Date  . CARDIAC CATHETERIZATION  2010  . HERNIA REPAIR     ingunial    Family History  Problem Relation Age of Onset  . Diabetes Mother   . Cancer Mother        renal  . Heart disease Father   . Stroke Father   . Diabetes Sister   . Colon cancer Neg Hx     Allergies  Allergen Reactions  . Iodine     REACTION: unspecified    Current Outpatient Prescriptions on File Prior to Visit  Medication Sig Dispense Refill  . aspirin 81 MG tablet Take 81 mg by mouth daily.    Marland Kitchen ibuprofen (ADVIL,MOTRIN) 600 MG tablet Take 1 tablet (600 mg total) by mouth every 8 (eight) hours as needed. (Patient taking differently: Take 200 mg by mouth every 8 (eight) hours as needed. ) 30 tablet 0  .  lisinopril (PRINIVIL,ZESTRIL) 20 MG tablet TAKE 1 TABLET BY MOUTH EVERY DAY 90 tablet 1  . simvastatin (ZOCOR) 20 MG tablet TAKE 1 TABLET BY MOUTH EVERY DAY IN THE EVENING 90 tablet 1   No current facility-administered medications on file prior to visit.     BP 118/70 (BP Location: Left Arm, Patient Position: Sitting, Cuff Size: Normal)   Pulse 75   Temp 98.2 F (36.8 C) (Oral)   Ht 5\' 11"  (1.803 m)   Wt 230 lb 9.6 oz (104.6 kg)   SpO2 97%   BMI 32.16 kg/m      Review of Systems  Constitutional: Negative for appetite change, chills, fatigue and fever.  HENT: Negative for congestion, dental problem, ear pain, hearing loss, sore throat, tinnitus, trouble swallowing and voice change.   Eyes: Negative for pain, discharge and visual disturbance.  Respiratory: Negative for cough, chest tightness, wheezing and stridor.   Cardiovascular: Negative for chest pain, palpitations and leg swelling.  Gastrointestinal: Negative for abdominal distention, abdominal pain, blood in stool, constipation, diarrhea, nausea and vomiting.  Genitourinary: Negative for difficulty urinating, discharge, flank pain, genital sores, hematuria and urgency.  Musculoskeletal: Negative for arthralgias, back  pain, gait problem, joint swelling, myalgias and neck stiffness.       Right upper arm discomfort  Skin: Negative for rash.  Neurological: Negative for dizziness, syncope, speech difficulty, weakness, numbness and headaches.  Hematological: Negative for adenopathy. Does not bruise/bleed easily.  Psychiatric/Behavioral: Negative for behavioral problems and dysphoric mood. The patient is not nervous/anxious.        Objective:   Physical Exam  Constitutional: He appears well-developed and well-nourished. No distress.  Blood pressure well controlled  Musculoskeletal:  very  Mild tenderness to the right biceps musculature to palpation Pronation and supination of the lower arm against resistance did aggravate the  biceps discomfort          Assessment & Plan:   Right biceps tendinitis.  Tendon appears intact.  The patient was giving rehabilitation exercises to perform.  If unimproved, will refer for sports medicine consult Essential hypertension, stable  Schedule CPX  Sabrina Keough Pilar Plate

## 2017-01-06 NOTE — Patient Instructions (Addendum)
Biceps Tendon Tendinitis (Distal) Distal biceps tendon tendinitis is inflammation of the distal biceps tendon. The distal biceps tendon is a strong cord of tissue that connects the biceps muscle, on the front of the upper arm, to a bone (radius) in the elbow. Distal biceps tendon tendinitis can interfere with the ability to bend the elbow and turn the hand palm-up (supination). This condition is usually caused by overusing the elbow joint and the biceps muscle, and it usually heals within 6 weeks. Distal biceps tendon tendinitis may include a grade 1 or grade 2 strain of the tendon. A grade 1 strain is mild, and it involves a slight pull of the tendon without any stretching or noticeable tearing of the tendon. There is usually no loss of biceps muscle strength. A grade 2 strain is moderate, and it involves a small tear in the tendon. The tendon is stretched, and biceps muscle strength is usually decreased. What are the causes? This condition may be caused by:  A sudden increase in the frequency or intensity of activity that involves the elbow and the biceps muscle.  Overuse of the biceps muscle. This can happen when you do the same movements over and over, such as: ? Supination. ? Forceful straightening (hyperextension) of the elbow. ? Bending of the elbow.  A direct, forceful hit or injury (trauma) to the elbow. This is rare.  What increases the risk? The following factors may make you more likely to develop this condition:  Playing contact sports.  Playing sports that involve throwing and overhead movements, including racket sports, gymnastics, weight lifting, or bodybuilding.  Doing physical labor.  Having poor strength and flexibility of the arm and shoulder.  Having injured other parts of the elbow.  What are the signs or symptoms? Symptoms of this condition may include:  Pain and inflammation in the front of the elbow. Pain may get worse during certain movements, such  as: ? Supination. ? Bending the elbow. ? Lifting or carrying objects. ? Throwing.  A feeling of warmth in the front of the elbow.  A crackling sound (crepitation) when you move or touch the elbow or the upper arm.  In some cases, symptoms may return (recur) after treatment, and they may be long-lasting (chronic). How is this diagnosed? This condition is diagnosed based on your symptoms, your medical history, and a physical exam. You may have tests, including X-rays or MRIs. Your health care provider may test your range of motion by having you do arm movements. How is this treated? This condition is treated by resting and icing the injured area, and by doing physical therapy exercises. Depending on the severity of your condition, treatment may also include:  Medicines to help relieve pain and inflammation.  Ultrasound therapy. This is the application of sound waves to the injured area.  Follow these instructions at home: Managing pain, stiffness, and swelling  If directed, put ice on the injured area: ? Put ice in a plastic bag. ? Place a towel between your skin and the bag. ? Leave the ice on for 20 minutes, 2-3 times a day.  Move your fingers often to avoid stiffness and to lessen swelling.  Raise (elevate) the injured area above the level of your heart while you are sitting or lying down.  If directed, apply heat to the affected area before you exercise. Use the heat source that your health care provider recommends, such as a moist heat pack or a heating pad. ? Place a towel between   your skin and the heat source. ? Leave the heat on for 20-30 minutes. ? Remove the heat if your skin turns bright red. This is especially important if you are unable to feel pain, heat, or cold. You may have a greater risk of getting burned. Activity  Return to your normal activities as told by your health care provider. Ask your health care provider what activities are safe for you.  Do not lift  anything that is heavier than 10 lb (4.5 kg) until your health care provider tells you that it is safe.  Avoid activities that cause pain or make your condition worse.  Do exercises as told by your health care provider. General instructions  Take over-the-counter and prescription medicines only as told by your health care provider.  Do not drive or operate heavy machinery while taking prescription pain medicines.  Keep all follow-up visits as told by your health care provider. This is important. How is this prevented?  Warm up and stretch before being active.  Cool down and stretch after being active.  Give your body time to rest between periods of activity.  Make sure to use equipment that fits you.  Be safe and responsible while being active to avoid falls.  Do at least 150 minutes of moderate-intensity exercise each week, such as brisk walking or water aerobics.  Maintain physical fitness, including: ? Strength. ? Flexibility. ? Cardiovascular fitness. ? Endurance. Contact a health care provider if:  You have symptoms that get worse or do not get better after 2 weeks of treatment.  You develop new symptoms. Get help right away if:  You develop severe pain. This information is not intended to replace advice given to you by your health care provider. Make sure you discuss any questions you have with your health care provider. Document Released: 04/04/2005 Document Revised: 12/10/2015 Document Reviewed: 03/13/2015 Elsevier Interactive Patient Education  2018 [employer].  Biceps Tendon Tendinitis (Distal) Rehab Ask your health care provider which exercises are safe for you. Do exercises exactly as told by your health care provider and adjust them as directed. It is normal to feel mild stretching, pulling, tightness, or discomfort as you do these exercises, but you should stop right away if you feel sudden pain or your pain gets worse.Do not begin these exercises until  told by your health care provider. Stretching and range of motion exercises These exercises warm up your muscles and joints and improve the movement and flexibility of your arm. These exercises can also help to relieve pain and stiffness. Exercise A: Supination, active-assisted 1. Stand or sit with your left / right elbow bent to an "L" shape (90 degrees). 2. Rotate your palm up until you cannot rotate it anymore. Then, use your other hand to help rotate your left / right forearm more. 3. Hold this position for __________ seconds. 4. Slowly return to the starting position. Repeat __________ times. Complete this exercise __________ times a day. Exercise B: Pronation, active-assisted  1. Stand or sit with your left / right elbow bent to an "L" shape (90 degrees). 2. Rotate your left / right palm down until you cannot rotate it anymore. Then, use your other hand to help rotate your left / right forearm more. 3. Hold this position for __________ seconds. 4. Slowly return to the starting position. Repeat __________ times. Complete this exercise __________ times a day. Strengthening exercises These exercises build strength and endurance in your arm and shoulder. Endurance is the ability to  use your muscles for a long time, even after your muscles get tired. Exercise C: Supination  1. Sit with your left / right forearm supported on a table. Your elbow should be at waist height. 2. Rest your hand over the edge of the table, palm-down. 3. Gently grasp a lightweight hammer near the head. As this exercise gets easier for you, try holding the hammer farther down the handle. 4. Without moving your elbow, slowly rotate your palm up so it faces the ceiling. 5. Hold this position for__________ seconds. 6. Slowly return to the starting position. Repeat __________ times. Complete this exercise __________ times a day. Exercise D: Pronation  1. Sit with your left / right forearm supported on a table. Your  elbow should be at waist height. 2. Rest your hand over the edge of the table, palm-up. 3. Gently grasp a lightweight hammer near the head. As this exercise gets easier for you, try holding the hammer farther down the handle. 4. Without moving your elbow, slowly rotate your palm down. 5. Hold this position for __________ seconds. 6. Slowly return to the starting position. Repeat __________ times. Complete this exercise __________ times a day. Exercise E: Elbow flexion, supinated  1. Sit on a stable chair without armrests, or stand. 2. If directed, hold a __________ weight in your left / right hand, or hold an exercise band with both hands. Your palms should face up toward the ceiling at the starting position. 3. Bend your left / right elbow and move your hand up toward your shoulder. Keep your other arm straight down, in the starting position. 4. Slowly return to the starting position. Repeat __________ times. Complete this exercise __________ times a day. Exercise F: Elbow extension  1. Lie on your back. 2. Hold a __________ weight in your left / right hand. 3. Bend your left / right elbow to an "L" shape (90 degrees) so your elbow is pointed up to the ceiling and the weight is overhead. 4. Straighten your elbow, raising your hand toward the ceiling. Use your other hand to support your left / right upper arm and to keep it still. 5. Slowly return to the starting position. Repeat __________ times. Complete this exercise __________ times a day. This information is not intended to replace advice given to you by your health care provider. Make sure you discuss any questions you have with your health care provider. Document Released: 04/04/2005 Document Revised: 12/10/2015 Document Reviewed: 03/13/2015 Elsevier Interactive Patient Education  [employer].

## 2017-01-08 ENCOUNTER — Other Ambulatory Visit: Payer: Self-pay | Admitting: Internal Medicine

## 2017-01-17 ENCOUNTER — Other Ambulatory Visit: Payer: Self-pay | Admitting: Internal Medicine

## 2017-02-09 DIAGNOSIS — H35373 Puckering of macula, bilateral: Secondary | ICD-10-CM | POA: Diagnosis not present

## 2017-02-09 DIAGNOSIS — H5203 Hypermetropia, bilateral: Secondary | ICD-10-CM | POA: Diagnosis not present

## 2017-02-09 DIAGNOSIS — H524 Presbyopia: Secondary | ICD-10-CM | POA: Diagnosis not present

## 2017-02-09 DIAGNOSIS — H2513 Age-related nuclear cataract, bilateral: Secondary | ICD-10-CM | POA: Diagnosis not present

## 2017-03-01 DIAGNOSIS — D2262 Melanocytic nevi of left upper limb, including shoulder: Secondary | ICD-10-CM | POA: Diagnosis not present

## 2017-03-01 DIAGNOSIS — L570 Actinic keratosis: Secondary | ICD-10-CM | POA: Diagnosis not present

## 2017-03-01 DIAGNOSIS — D2372 Other benign neoplasm of skin of left lower limb, including hip: Secondary | ICD-10-CM | POA: Diagnosis not present

## 2017-03-01 DIAGNOSIS — D225 Melanocytic nevi of trunk: Secondary | ICD-10-CM | POA: Diagnosis not present

## 2017-03-01 DIAGNOSIS — D1801 Hemangioma of skin and subcutaneous tissue: Secondary | ICD-10-CM | POA: Diagnosis not present

## 2017-03-01 DIAGNOSIS — L308 Other specified dermatitis: Secondary | ICD-10-CM | POA: Diagnosis not present

## 2017-03-01 DIAGNOSIS — C44319 Basal cell carcinoma of skin of other parts of face: Secondary | ICD-10-CM | POA: Diagnosis not present

## 2017-03-01 DIAGNOSIS — L814 Other melanin hyperpigmentation: Secondary | ICD-10-CM | POA: Diagnosis not present

## 2017-03-01 DIAGNOSIS — Z85828 Personal history of other malignant neoplasm of skin: Secondary | ICD-10-CM | POA: Diagnosis not present

## 2017-03-01 DIAGNOSIS — D2261 Melanocytic nevi of right upper limb, including shoulder: Secondary | ICD-10-CM | POA: Diagnosis not present

## 2017-03-01 DIAGNOSIS — L821 Other seborrheic keratosis: Secondary | ICD-10-CM | POA: Diagnosis not present

## 2017-03-30 ENCOUNTER — Telehealth: Payer: Self-pay

## 2017-04-04 NOTE — Telephone Encounter (Signed)
Erroneous encounter / to close

## 2017-04-08 ENCOUNTER — Other Ambulatory Visit: Payer: Self-pay | Admitting: Internal Medicine

## 2017-06-28 ENCOUNTER — Encounter: Payer: Self-pay | Admitting: Internal Medicine

## 2017-07-02 ENCOUNTER — Encounter: Payer: Self-pay | Admitting: Internal Medicine

## 2017-07-04 ENCOUNTER — Encounter: Payer: Self-pay | Admitting: Internal Medicine

## 2017-07-04 ENCOUNTER — Ambulatory Visit (INDEPENDENT_AMBULATORY_CARE_PROVIDER_SITE_OTHER): Payer: Medicare HMO | Admitting: Internal Medicine

## 2017-07-04 VITALS — BP 124/78 | HR 90 | Temp 98.4°F | Wt 225.0 lb

## 2017-07-04 DIAGNOSIS — J069 Acute upper respiratory infection, unspecified: Secondary | ICD-10-CM | POA: Diagnosis not present

## 2017-07-04 DIAGNOSIS — B9789 Other viral agents as the cause of diseases classified elsewhere: Secondary | ICD-10-CM

## 2017-07-04 DIAGNOSIS — I10 Essential (primary) hypertension: Secondary | ICD-10-CM

## 2017-07-04 MED ORDER — HYDROCODONE-HOMATROPINE 5-1.5 MG/5ML PO SYRP: 5.0000 mL | ORAL_SOLUTION | Freq: Four times a day (QID) | ORAL | 0 refills | Status: AC | PRN

## 2017-07-04 MED ORDER — SIMVASTATIN 20 MG PO TABS: ORAL_TABLET | ORAL | 3 refills | Status: DC

## 2017-07-04 MED ORDER — PREDNISONE 20 MG PO TABS: 20.0000 mg | ORAL_TABLET | Freq: Two times a day (BID) | ORAL | 0 refills | Status: DC

## 2017-07-04 MED ORDER — LISINOPRIL 20 MG PO TABS: 20.0000 mg | ORAL_TABLET | Freq: Every day | ORAL | 3 refills | Status: DC

## 2017-07-04 NOTE — Progress Notes (Signed)
Subjective:    Patient ID: [name], male    DOB: 05-26-1945, 72 y.o.   MRN: [id number]  HPI  72 year old patient  who presents with a 2-1/2-week history of head and chest congestion.  Prominent symptoms include sore throat and cough.  He has considerable postnasal drip and has had a very difficult time sleeping at night.  He has tried a number of OTC products without much benefit.  He is also used Aleve and cough drops.  Symptoms are manageable throughout the day but quite bothersome at night   Past Medical History:  Diagnosis Date  . BACK PAIN, CHRONIC 03/12/2007  . Compression fracture of thoracic vertebra (HCC)    traumatic    T8  . CORONARY ARTERY DISEASE 08/18/2008  . HYPERLIPIDEMIA 08/18/2008  . HYPERTENSION 03/12/2007  . IGT (impaired glucose tolerance)   . OSTEOARTHRITIS 03/12/2007     Social History   Socioeconomic History  . Marital status: Married    Spouse name: Not on file  . Number of children: Not on file  . Years of education: Not on file  . Highest education level: Not on file  Social Needs  . Financial resource strain: Not on file  . Food insecurity - worry: Not on file  . Food insecurity - inability: Not on file  . Transportation needs - medical: Not on file  . Transportation needs - non-medical: Not on file  Occupational History  . Not on file  Tobacco Use  . Smoking status: Former Smoker    Last attempt to quit: 04/18/1970    Years since quitting: 47.2  . Smokeless tobacco: Never Used  Substance and Sexual Activity  . Alcohol use: Yes    Alcohol/week: 8.4 oz    Types: 7 Glasses of wine, 7 Standard drinks or equivalent per week  . Drug use: No  . Sexual activity: Not on file  Other Topics Concern  . Not on file  Social History Narrative  . Not on file    Past Surgical History:  Procedure Laterality Date  . CARDIAC CATHETERIZATION  2010  . HERNIA REPAIR     ingunial    Family History  Problem Relation Age of Onset  . Diabetes Mother     . Cancer Mother        renal  . Heart disease Father   . Stroke Father   . Diabetes Sister   . Colon cancer Neg Hx     Allergies  Allergen Reactions  . Iodine     REACTION: unspecified    Current Outpatient Medications on File Prior to Visit  Medication Sig Dispense Refill  . aspirin 81 MG tablet Take 81 mg by mouth daily.    Marland Kitchen ibuprofen (ADVIL,MOTRIN) 600 MG tablet Take 1 tablet (600 mg total) by mouth every 8 (eight) hours as needed. (Patient taking differently: Take 200 mg by mouth every 8 (eight) hours as needed. ) 30 tablet 0  . lisinopril (PRINIVIL,ZESTRIL) 20 MG tablet TAKE 1 TABLET BY MOUTH EVERY DAY 90 tablet 0  . simvastatin (ZOCOR) 20 MG tablet TAKE 1 TABLET BY MOUTH EVERY DAY IN THE EVENING 90 tablet 1   No current facility-administered medications on file prior to visit.     BP 124/78   Pulse 90   Temp 98.4 F (36.9 C) (Oral)   Wt 225 lb (102.1 kg)   SpO2 97%   BMI 31.38 kg/m    Review of Systems  Constitutional: Positive for activity change,  appetite change and fatigue. Negative for chills and fever.  HENT: Positive for congestion, postnasal drip, rhinorrhea and sore throat. Negative for dental problem, ear pain, hearing loss, tinnitus, trouble swallowing and voice change.   Eyes: Negative for pain, discharge and visual disturbance.  Respiratory: Positive for cough. Negative for chest tightness, wheezing and stridor.   Cardiovascular: Negative for chest pain, palpitations and leg swelling.  Gastrointestinal: Negative for abdominal distention, abdominal pain, blood in stool, constipation, diarrhea, nausea and vomiting.  Genitourinary: Negative for difficulty urinating, discharge, flank pain, genital sores, hematuria and urgency.  Musculoskeletal: Negative for arthralgias, back pain, gait problem, joint swelling, myalgias and neck stiffness.  Skin: Negative for rash.  Neurological: Negative for dizziness, syncope, speech difficulty, weakness, numbness and  headaches.  Hematological: Negative for adenopathy. Does not bruise/bleed easily.  Psychiatric/Behavioral: Negative for behavioral problems and dysphoric mood. The patient is not nervous/anxious.        Objective:   Physical Exam  Constitutional: He is oriented to person, place, and time. He appears well-developed.  HENT:  Head: Normocephalic.  Right Ear: External ear normal.  Left Ear: External ear normal.  Mild erythema of the oropharynx  Eyes: Conjunctivae and EOM are normal.  Neck: Normal range of motion.  Cardiovascular: Normal rate and normal heart sounds.  Pulmonary/Chest:  scattered rhonchi and a few faint wheezes  Abdominal: Bowel sounds are normal.  Musculoskeletal: Normal range of motion. He exhibits no edema or tenderness.  Lymphadenopathy:    He has no cervical adenopathy.  Neurological: He is alert and oriented to person, place, and time.  Psychiatric: He has a normal mood and affect. His behavior is normal.          Assessment & Plan:   Viral URI with cough Mild viral asthmatic bronchitis  Symptoms have been refractory to a number of OTC products.  Will treat with short-term oral prednisone as well as an antitussives.  Rest and hydration encouraged  Nyoka Cowden

## 2017-07-04 NOTE — Patient Instructions (Signed)
Hydrate and Humidify  Drink enough water to keep your urine clear or pale yellow. Staying hydrated will help to thin your mucus.  Use a cool mist humidifier to keep the humidity level in your home above 50%.  Inhale steam for 10-15 minutes, 3-4 times a day or as told by your health care provider. You can do this in the bathroom while a hot shower is running.  Limit your exposure to cool or dry air. Rest  Rest as much as possible.  Sleep with your head raised (elevated).  

## 2017-07-13 ENCOUNTER — Encounter: Payer: Self-pay | Admitting: Internal Medicine

## 2017-07-17 ENCOUNTER — Encounter: Payer: Self-pay | Admitting: Family Medicine

## 2017-07-17 ENCOUNTER — Ambulatory Visit (INDEPENDENT_AMBULATORY_CARE_PROVIDER_SITE_OTHER): Payer: Medicare HMO | Admitting: Family Medicine

## 2017-07-17 VITALS — BP 116/70 | HR 80 | Temp 98.0°F | Ht 71.0 in | Wt 226.2 lb

## 2017-07-17 DIAGNOSIS — T464X5A Adverse effect of angiotensin-converting-enzyme inhibitors, initial encounter: Secondary | ICD-10-CM | POA: Diagnosis not present

## 2017-07-17 DIAGNOSIS — R05 Cough: Secondary | ICD-10-CM

## 2017-07-17 DIAGNOSIS — I10 Essential (primary) hypertension: Secondary | ICD-10-CM | POA: Diagnosis not present

## 2017-07-17 MED ORDER — HYDROCODONE-HOMATROPINE 5-1.5 MG/5ML PO SYRP: 5.0000 mL | ORAL_SOLUTION | ORAL | 0 refills | Status: DC | PRN

## 2017-07-17 MED ORDER — LOSARTAN POTASSIUM 50 MG PO TABS: 50.0000 mg | ORAL_TABLET | Freq: Every day | ORAL | 3 refills | Status: DC

## 2017-07-17 NOTE — Progress Notes (Signed)
   Subjective:    Patient ID: [name], male    DOB: Feb 17, 1946, 72 y.o.   MRN: [id number]  HPI Here for a 6 week hx of coughing. He feels well, no fever. The cough is a tickling dry cough that is centered in the throat. He has tried cough syrup and a course of Prednisone, but this has not helped. He denies any GERD symptoms.    Review of Systems  Constitutional: Negative.   HENT: Negative.   Eyes: Negative.   Respiratory: Positive for cough. Negative for choking, chest tightness, shortness of breath and wheezing.   Cardiovascular: Negative.        Objective:   Physical Exam  Constitutional: He appears well-developed and well-nourished.  HENT:  Right Ear: External ear normal.  Left Ear: External ear normal.  Nose: Nose normal.  Mouth/Throat: Oropharynx is clear and moist.  Eyes: Conjunctivae are normal.  Neck: No thyromegaly present.  Pulmonary/Chest: Effort normal and breath sounds normal. No respiratory distress. He has no wheezes. He has no rales.  Lymphadenopathy:    He has no cervical adenopathy.          Assessment & Plan:  Chronic dry cough, likely related to his ACE inhibitor. He will stop Lisinopril and start on Losartan 50 mg daily. Recheck prn.  Alysia Penna, MD

## 2017-07-20 NOTE — Telephone Encounter (Signed)
This message sent on 07/14/17.  Pt in office on 07/17/17 and seen Dr. Sarajane Jews.  See ov note.  No further action required.  Will close message.

## 2017-07-25 DIAGNOSIS — R69 Illness, unspecified: Secondary | ICD-10-CM | POA: Diagnosis not present

## 2017-08-15 ENCOUNTER — Encounter: Payer: Self-pay | Admitting: Family Medicine

## 2017-08-15 ENCOUNTER — Ambulatory Visit (INDEPENDENT_AMBULATORY_CARE_PROVIDER_SITE_OTHER): Payer: Medicare HMO | Admitting: Family Medicine

## 2017-08-15 VITALS — BP 120/80 | HR 80 | Temp 98.7°F | Ht 71.0 in | Wt 225.8 lb

## 2017-08-15 DIAGNOSIS — J019 Acute sinusitis, unspecified: Secondary | ICD-10-CM

## 2017-08-15 MED ORDER — AZITHROMYCIN 250 MG PO TABS: ORAL_TABLET | ORAL | 0 refills | Status: DC

## 2017-08-15 NOTE — Progress Notes (Signed)
   Subjective:    Patient ID: [name], male    DOB: [birth year], 72 y.o.   MRN: [id number]  HPI Here for 3 days of stuffy head, PND, and coughing up green sputum. No fever. He was here 3 weeks ago for a dry tickling cough that we felt was a side effect of his Lisinopril. We stopped that and switched him to Losartan. Sure enough within a week that cough resolved. Now he feels ill.    Review of Systems  Constitutional: Negative.   HENT: Positive for congestion, postnasal drip, sinus pressure and sinus pain. Negative for sore throat.   Eyes: Negative.   Respiratory: Positive for cough.        Objective:   Physical Exam  Constitutional: He appears well-developed and well-nourished.  HENT:  Right Ear: External ear normal.  Left Ear: External ear normal.  Nose: Nose normal.  Mouth/Throat: Oropharynx is clear and moist.  Eyes: Pupils are equal, round, and reactive to light. Conjunctivae and EOM are normal.  Neck: No thyromegaly present.  Pulmonary/Chest: Effort normal and breath sounds normal.  Lymphadenopathy:    He has no cervical adenopathy.          Assessment & Plan:  Sinusitis, treat with a Zpack.  Alysia Penna, MD

## 2017-09-06 ENCOUNTER — Encounter: Payer: Self-pay | Admitting: Gastroenterology

## 2017-10-03 DIAGNOSIS — E785 Hyperlipidemia, unspecified: Secondary | ICD-10-CM | POA: Diagnosis not present

## 2017-10-03 DIAGNOSIS — Z8249 Family history of ischemic heart disease and other diseases of the circulatory system: Secondary | ICD-10-CM | POA: Diagnosis not present

## 2017-10-03 DIAGNOSIS — Z791 Long term (current) use of non-steroidal anti-inflammatories (NSAID): Secondary | ICD-10-CM | POA: Diagnosis not present

## 2017-10-03 DIAGNOSIS — Z841 Family history of disorders of kidney and ureter: Secondary | ICD-10-CM | POA: Diagnosis not present

## 2017-10-03 DIAGNOSIS — Z6831 Body mass index (BMI) 31.0-31.9, adult: Secondary | ICD-10-CM | POA: Diagnosis not present

## 2017-10-03 DIAGNOSIS — G8929 Other chronic pain: Secondary | ICD-10-CM | POA: Diagnosis not present

## 2017-10-03 DIAGNOSIS — Z7982 Long term (current) use of aspirin: Secondary | ICD-10-CM | POA: Diagnosis not present

## 2017-10-03 DIAGNOSIS — E669 Obesity, unspecified: Secondary | ICD-10-CM | POA: Diagnosis not present

## 2017-10-03 DIAGNOSIS — I10 Essential (primary) hypertension: Secondary | ICD-10-CM | POA: Diagnosis not present

## 2017-10-04 ENCOUNTER — Other Ambulatory Visit: Payer: Self-pay | Admitting: Internal Medicine

## 2017-10-04 NOTE — Telephone Encounter (Signed)
Copied from [place] [id number]. Topic: Quick Communication - Rx Refill/Question >> Oct 04, 2017 11:32 AM Cecelia Byars, NT wrote: Medication:  losartan (COZAAR) 50 MG tablet   Has the patient contacted their pharmacy? yes  (Agent: If no, request that the patient contact the pharmacy for the refill. (Agent: If yes, when and what did the pharmacy advise  [id number] - [place], [place] [phone] (Phone) [phone] (Fax)     Preferred Pharmacy (with phone number or street name   Agent: Please be advised that RX refills may take up to 3 business days. We ask that you follow-up with your pharmacy.

## 2017-10-05 NOTE — Telephone Encounter (Signed)
Rx refill: losartan 50 mg        Last filled: 07/17/17 3RF  LOV: 08/15/17 acute CPE due ? 9/19  PCP: [place]: verified

## 2017-10-06 MED ORDER — LOSARTAN POTASSIUM 50 MG PO TABS: 50.0000 mg | ORAL_TABLET | Freq: Every day | ORAL | 3 refills | Status: DC

## 2017-10-09 ENCOUNTER — Encounter: Payer: Self-pay | Admitting: Internal Medicine

## 2017-10-16 NOTE — Telephone Encounter (Signed)
Called pt to see if he was still experiencing dizziness. No answer will try again shortly.

## 2017-11-28 ENCOUNTER — Ambulatory Visit (AMBULATORY_SURGERY_CENTER): Payer: Self-pay | Admitting: *Deleted

## 2017-11-28 VITALS — Ht 71.0 in | Wt 234.0 lb

## 2017-11-28 DIAGNOSIS — Z86010 Personal history of colonic polyps: Secondary | ICD-10-CM

## 2017-11-28 MED ORDER — NA SULFATE-K SULFATE-MG SULF 17.5-3.13-1.6 GM/177ML PO SOLN: 1.0000 | Freq: Once | ORAL | 0 refills | Status: AC

## 2017-11-28 NOTE — Progress Notes (Signed)
No egg or soy allergy known to patient  No issues with past sedation with any surgeries  or procedures, no intubation problems  No diet pills per patient No home 02 use per patient  No blood thinners per patient  Pt denies issues with constipation  No A fib or A flutter  EMMI video sent to pt's e mail  

## 2017-11-30 ENCOUNTER — Encounter: Payer: Self-pay | Admitting: Gastroenterology

## 2017-12-05 IMAGING — MR MR HIP*L* W/O CM
4 of 5 series · 27 of 40 positions shown · non-contrast
Comparison: None.

CLINICAL DATA: Left hip pain and burning for 18 months.

EXAM:
MR OF THE LEFT HIP WITHOUT CONTRAST
TECHNIQUE: Multiplanar, multisequence MR imaging was performed. No intravenous
contrast was administered.

[Series 8: STIR · coronal · left · 3.0mm · 1.25mm/px · 3 of 25 slices shown]
[im 5/25]
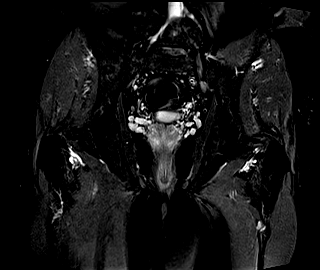
[im 15/25]
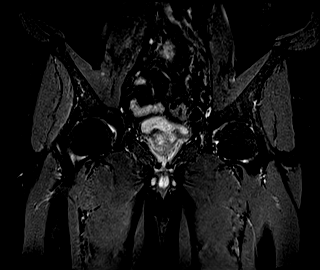
[im 25/25]
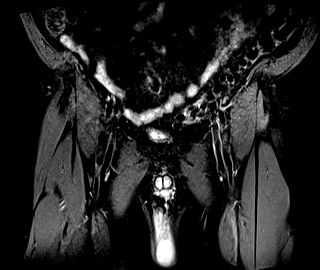

[Series 9: T1 · coronal · left · 3.0mm · 0.78mm/px · 7 of 25 slices shown (1 of 2)]
[im 1/25]
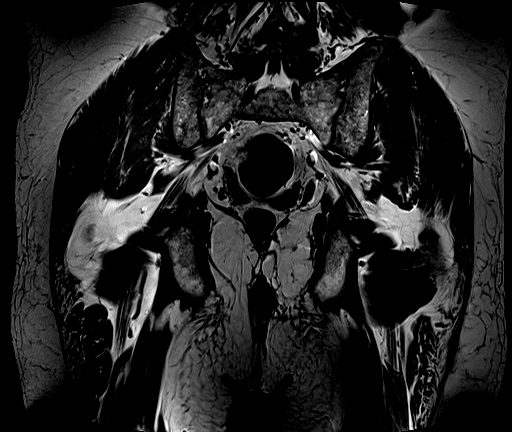
[im 5/25]
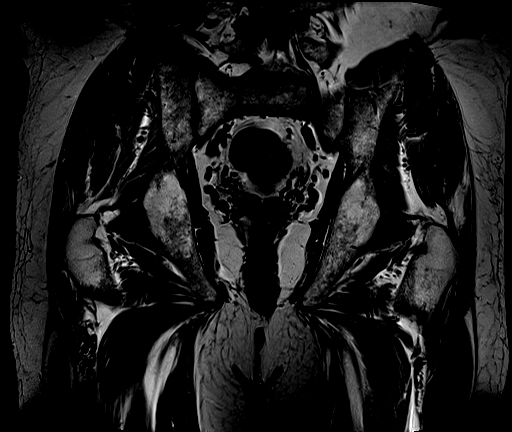
[im 9/25]
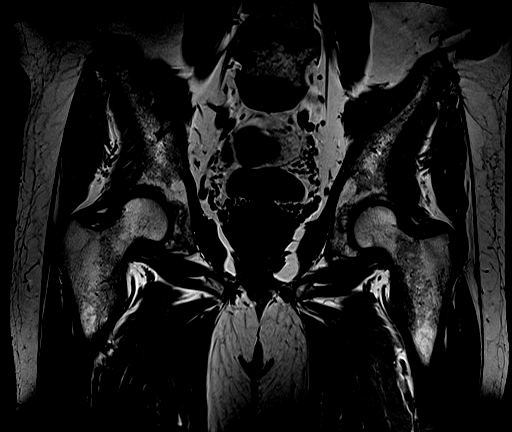
[im 13/25]
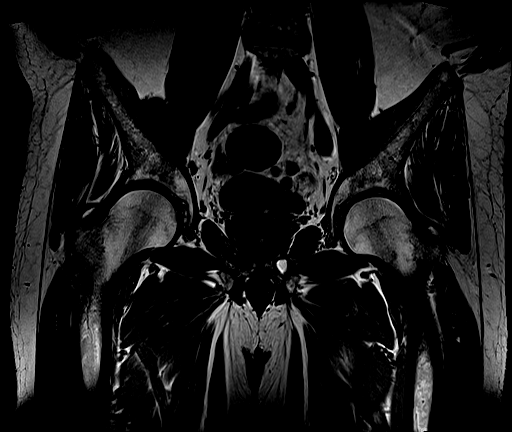
[im 17/25]
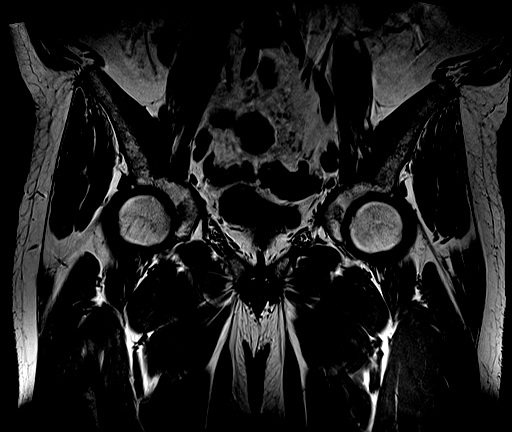
[im 21/25]
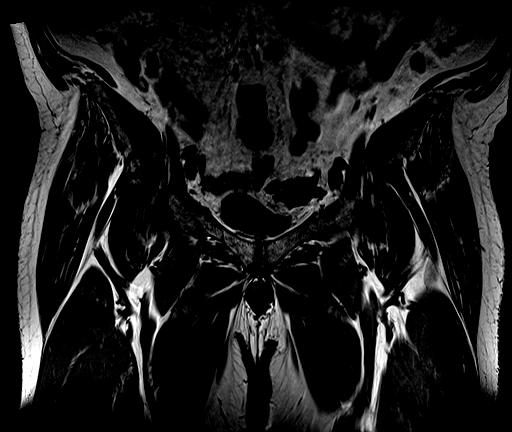
[im 25/25]
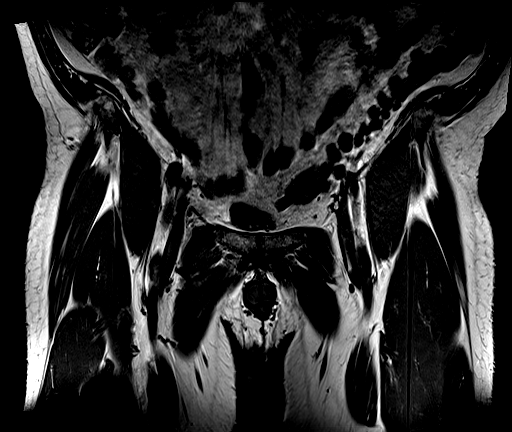

[Series 10: T1 · axial · left · 3.0mm · 0.78mm/px · z∈[-25,+76]mm · 8 of 34 slices shown (2 of 2)]
[im 1/34]
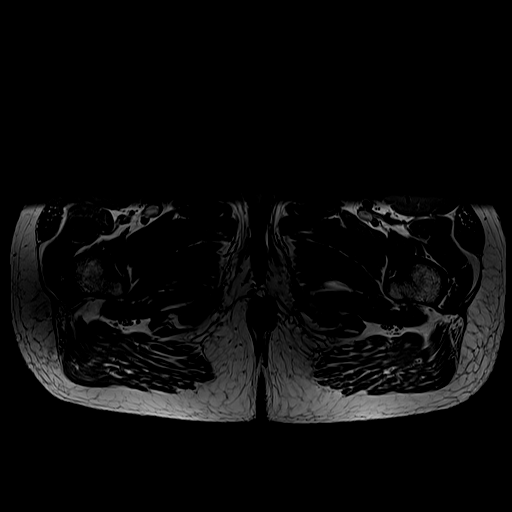
[im 5/34]
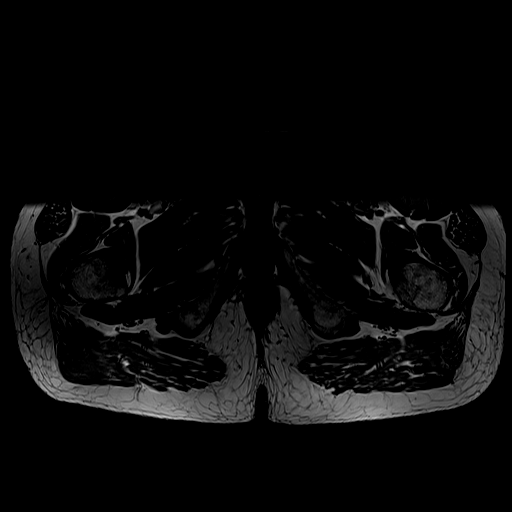
[im 9/34]
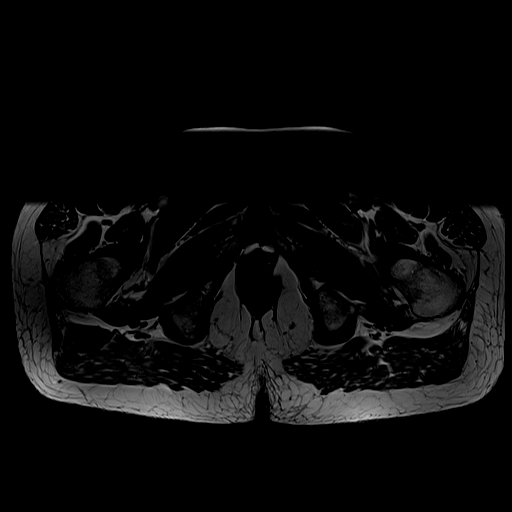
[im 13/34]
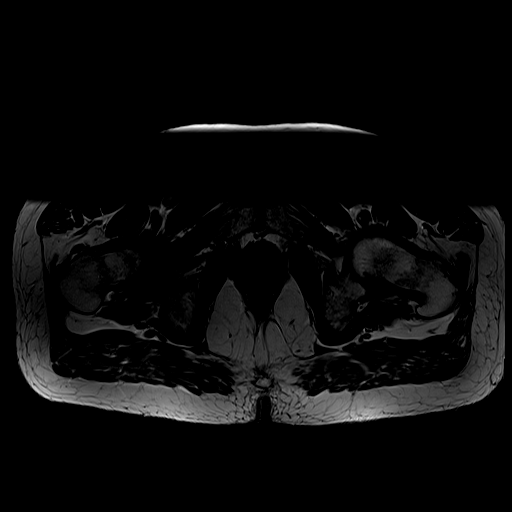
[im 17/34]
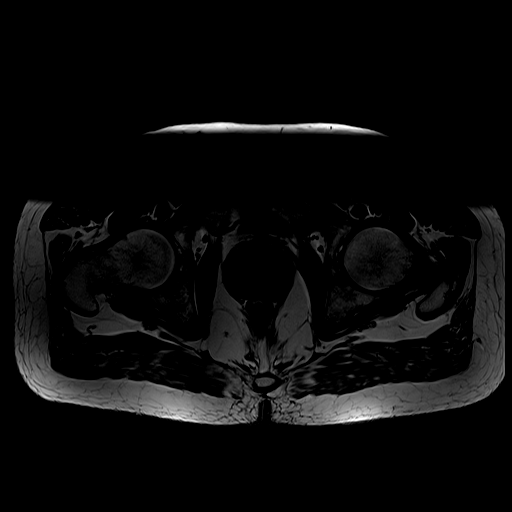
[im 21/34]
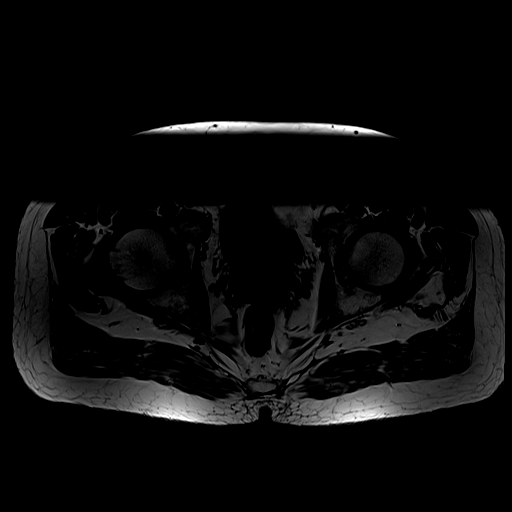
[im 25/34]
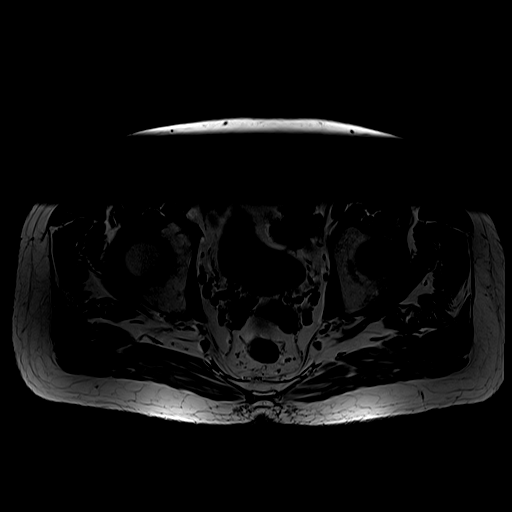
[im 29/34]
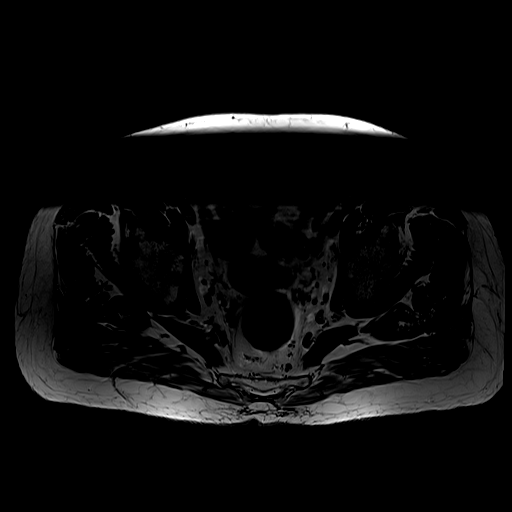

[Series 12: PD · sagittal · left · 3.0mm · 0.56mm/px · 9 of 35 slices shown]
[im 1/35]
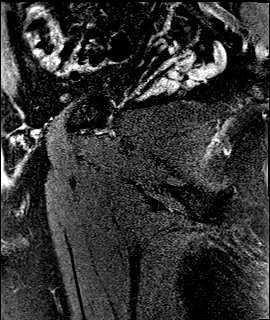
[im 5/35]
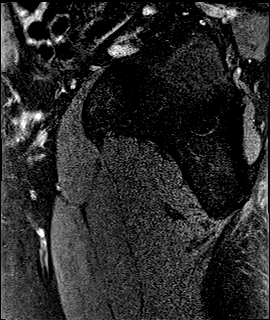
[im 9/35]
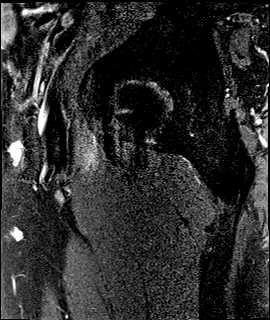
[im 13/35]
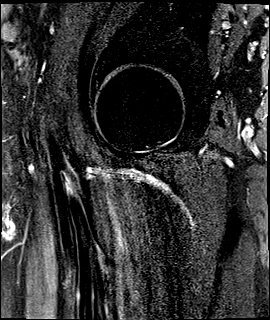
[im 18/35]
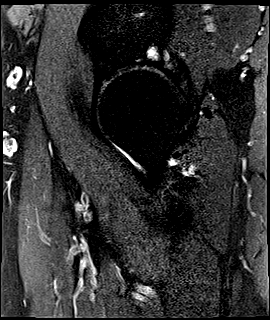
[im 22/35]
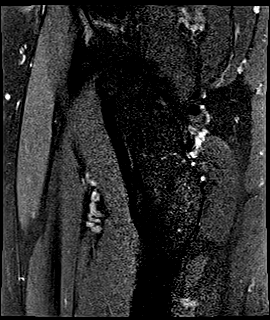
[im 26/35]
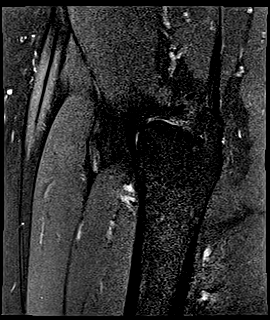
[im 30/35]
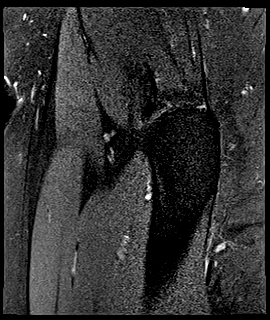
[im 35/35]
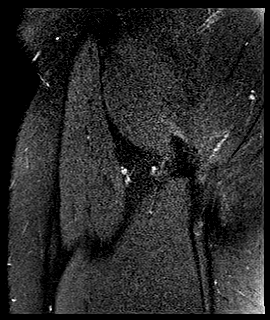

[27 of 40 positions shown; findings below may reference images not displayed]

FINDINGS: Bones: Narrowing of the sacroiliac joints bilaterally without
bridging fusion. Schmorl's node along the superior endplate of L5
with adjacent low-level marrow edema signal. This is stable from
04/14/2015.

1.6 by 1.2 by 1.1 cm multilobular cystic lesion in the left lateral
superior acetabulum. This has an anterior component that has a
sclerotic margin.

Articular cartilage and labrum

Articular cartilage: Mild degenerative chondral thinning in both
hips.

Labrum:  I do not observe a definite labral tear.

Joint or bursal effusion

Joint effusion:  Absent

Bursae:  No bursitis

Muscles and tendons

Muscles and tendons:  Unremarkable

Other findings

Miscellaneous: Sigmoid colon diverticulosis. Benign prostatic
hypertrophy, prostate gland proximally 5.7 by 4.1 cm, subtly reduced
T2 signal in the peripheral zone of the prostate gland in the right
and left paramedian locations of the apex on image 20 series 11.
IMPRESSION: 1. Confluent subcortical cystic lesions along the superior and
posterior superior left acetabulum may represent geodes or
degenerative subcortical cysts. Intraosseous extension of a
paralabral cyst in the setting of an otherwise occult labral tear is
also a differential diagnostic consideration.
2. Mild degenerative chondral thinning in both hips.
3. Benign prostatic hypertrophy. Faint T2 heterogeneity along the
prostate gland peripheral zone apex -correlate with PSA level in
determining any need for further workup.
4. Mild narrowing of the sacroiliac joints bilaterally without
bridging fusion. This could be from a low-grade chronic
sacroiliitis.

## 2017-12-12 ENCOUNTER — Ambulatory Visit (AMBULATORY_SURGERY_CENTER): Payer: Medicare HMO | Admitting: Gastroenterology

## 2017-12-12 ENCOUNTER — Encounter: Payer: Self-pay | Admitting: Gastroenterology

## 2017-12-12 VITALS — BP 104/65 | HR 87 | Temp 98.4°F | Resp 12 | Ht 71.0 in | Wt 234.0 lb

## 2017-12-12 DIAGNOSIS — Z86010 Personal history of colonic polyps: Secondary | ICD-10-CM | POA: Diagnosis not present

## 2017-12-12 DIAGNOSIS — D120 Benign neoplasm of cecum: Secondary | ICD-10-CM | POA: Diagnosis not present

## 2017-12-12 DIAGNOSIS — D122 Benign neoplasm of ascending colon: Secondary | ICD-10-CM | POA: Diagnosis not present

## 2017-12-12 DIAGNOSIS — D123 Benign neoplasm of transverse colon: Secondary | ICD-10-CM | POA: Diagnosis not present

## 2017-12-12 DIAGNOSIS — I2510 Atherosclerotic heart disease of native coronary artery without angina pectoris: Secondary | ICD-10-CM | POA: Diagnosis not present

## 2017-12-12 DIAGNOSIS — I10 Essential (primary) hypertension: Secondary | ICD-10-CM | POA: Diagnosis not present

## 2017-12-12 MED ORDER — SODIUM CHLORIDE 0.9 % IV SOLN: 500.0000 mL | Freq: Once | INTRAVENOUS | Status: AC

## 2017-12-12 NOTE — Progress Notes (Signed)
Called to room to assist during endoscopic procedure.  Patient ID and intended procedure confirmed with present staff. Received instructions for my participation in the procedure from the performing physician.  

## 2017-12-12 NOTE — Patient Instructions (Signed)
YOU HAD AN ENDOSCOPIC PROCEDURE TODAY AT [facility]:   Refer to the procedure report that was given to you for any specific questions about what was found during the examination.  If the procedure report does not answer your questions, please call your gastroenterologist to clarify.  If you requested that your care partner not be given the details of your procedure findings, then the procedure report has been included in a sealed envelope for you to review at your convenience later.  YOU SHOULD EXPECT: Some feelings of bloating in the abdomen. Passage of more gas than usual.  Walking can help get rid of the air that was put into your GI tract during the procedure and reduce the bloating. If you had a lower endoscopy (such as a colonoscopy or flexible sigmoidoscopy) you may notice spotting of blood in your stool or on the toilet paper. If you underwent a bowel prep for your procedure, you may not have a normal bowel movement for a few days.  Please Note:  You might notice some irritation and congestion in your nose or some drainage.  This is from the oxygen used during your procedure.  There is no need for concern and it should clear up in a day or so.  SYMPTOMS TO REPORT IMMEDIATELY:   Following lower endoscopy (colonoscopy or flexible sigmoidoscopy):  Excessive amounts of blood in the stool  Significant tenderness or worsening of abdominal pains  Swelling of the abdomen that is new, acute  Fever of 100F or higher   For urgent or emergent issues, a gastroenterologist can be reached at any hour by calling [phone].   DIET:  We do recommend a small meal at first, but then you may proceed to your regular diet.  Drink plenty of fluids but you should avoid alcoholic beverages for 24 hours.  ACTIVITY:  You should plan to take it easy for the rest of today and you should NOT DRIVE or use heavy machinery until tomorrow (because of the sedation medicines used during the test).     FOLLOW UP: Our staff will call the number listed on your records the next business day following your procedure to check on you and address any questions or concerns that you may have regarding the information given to you following your procedure. If we do not reach you, we will leave a message.  However, if you are feeling well and you are not experiencing any problems, there is no need to return our call.  We will assume that you have returned to your regular daily activities without incident.  If any biopsies were taken you will be contacted by phone or by letter within the next 1-3 weeks.  Please call us at [phone] if you have not heard about the biopsies in 3 weeks.    SIGNATURES/CONFIDENTIALITY: You and/or your care partner have signed paperwork which will be entered into your electronic medical record.  These signatures attest to the fact that that the information above on your After Visit Summary has been reviewed and is understood.  Full responsibility of the confidentiality of this discharge information lies with you and/or your care-partner.  Red all of the handouts given to you by your recovery room nurse.

## 2017-12-12 NOTE — Progress Notes (Signed)
Pt's states no medical or surgical changes since previsit or office visit. 

## 2017-12-12 NOTE — Progress Notes (Signed)
Report given to PACU, vss 

## 2017-12-12 NOTE — Op Note (Signed)
[facility] Patient Name: [name] Procedure Date: 12/12/2017 11:39 AM MRN: [id number] Endoscopist: Remo Lipps P. Havery Moros , MD Age: 72 Referring MD:  Date of Birth: [birth year] Gender: Male Account #: [id number] Procedure:                Colonoscopy Indications:              Surveillance: Personal history of adenomatous                            polyps on last colonoscopy 3 years ago Medicines:                Monitored Anesthesia Care Procedure:                Pre-Anesthesia Assessment:                           - Prior to the procedure, a History and Physical                            was performed, and patient medications and                            allergies were reviewed. The patient's tolerance of                            previous anesthesia was also reviewed. The risks                            and benefits of the procedure and the sedation                            options and risks were discussed with the patient.                            All questions were answered, and informed consent                            was obtained. Prior Anticoagulants: The patient has                            taken no previous anticoagulant or antiplatelet                            agents. ASA Grade Assessment: III - A patient with                            severe systemic disease. After reviewing the risks                            and benefits, the patient was deemed in                            satisfactory condition to undergo the procedure.  After obtaining informed consent, the colonoscope                            was passed under direct vision. Throughout the                            procedure, the patient's blood pressure, pulse, and                            oxygen saturations were monitored continuously. The                            Colonoscope was introduced through the anus and                            advanced to the the  cecum, identified by                            appendiceal orifice and ileocecal valve. The                            colonoscopy was performed without difficulty. The                            patient tolerated the procedure well. The quality                            of the bowel preparation was good. The ileocecal                            valve, appendiceal orifice, and rectum were                            photographed. Scope In: 53:61:44 AM Scope Out: 12:12:06 PM Scope Withdrawal Time: 0 hours 17 minutes 34 seconds  Total Procedure Duration: 0 hours 25 minutes 20 seconds  Findings:                 The perianal and digital rectal examinations were                            normal.                           Two sessile polyps were found in the cecum. The                            polyps were 3 to 4 mm in size. These polyps were                            removed with a cold snare. Resection and retrieval                            were complete.  Two sessile polyps were found in the ascending                            colon. The polyps were 3 to 5 mm in size. These                            polyps were removed with a cold snare. Resection                            and retrieval were complete.                           Two sessile polyps were found in the hepatic                            flexure. The polyps were 5 to 6 mm in size. These                            polyps were removed with a cold snare. Resection                            and retrieval were complete.                           Two flat polyps were found in the transverse colon.                            The polyps were 3 to 5 mm in size. These polyps                            were removed with a cold snare. Resection and                            retrieval were complete.                           Many medium-mouthed diverticula were found in the                             sigmoid colon.                           The exam was otherwise without abnormality on                            direct and retroflexion views. Complications:            No immediate complications. Estimated blood loss:                            Minimal. Estimated Blood Loss:     Estimated blood loss was minimal. Impression:               - Two 3 to 4 mm polyps in the cecum, removed with  a                            cold snare. Resected and retrieved.                           - Two 3 to 5 mm polyps in the ascending colon,                            removed with a cold snare. Resected and retrieved.                           - Two 5 to 6 mm polyps at the hepatic flexure,                            removed with a cold snare. Resected and retrieved.                           - Two 3 to 5 mm polyps in the transverse colon,                            removed with a cold snare. Resected and retrieved.                           - Diverticulosis in the sigmoid colon.                           - The examination was otherwise normal on direct                            and retroflexion views. Recommendation:           - Patient has a contact number available for                            emergencies. The signs and symptoms of potential                            delayed complications were discussed with the                            patient. Return to normal activities tomorrow.                            Written discharge instructions were provided to the                            patient.                           - Resume previous diet.                           - Continue present medications.                           -  Await pathology results.                           - Repeat colonoscopy for surveillance based on                            pathology results. Remo Lipps P. Hollye Pritt, MD 12/12/2017 12:17:28 PM This report has been signed electronically.

## 2017-12-13 ENCOUNTER — Telehealth: Payer: Self-pay | Admitting: *Deleted

## 2017-12-13 NOTE — Telephone Encounter (Signed)
  Follow up Call-  Call back number 12/12/2017  Post procedure Call Back phone  # [phone]  Permission to leave phone message Yes  Some recent data might be hidden     Patient questions:  Do you have a fever, pain , or abdominal swelling? No. Pain Score  0 *  Have you tolerated food without any problems? Yes.    Have you been able to return to your normal activities? Yes.    Do you have any questions about your discharge instructions: Diet   No. Medications  No. Follow up visit  No.  Do you have questions or concerns about your Care? No.  Actions: * If pain score is 4 or above: No action needed, pain <4.

## 2017-12-14 ENCOUNTER — Encounter: Payer: Self-pay | Admitting: Gastroenterology

## 2018-01-22 ENCOUNTER — Encounter: Payer: Medicare HMO | Admitting: Family Medicine

## 2018-01-29 ENCOUNTER — Ambulatory Visit (INDEPENDENT_AMBULATORY_CARE_PROVIDER_SITE_OTHER): Payer: Medicare HMO | Admitting: Family Medicine

## 2018-01-29 ENCOUNTER — Encounter: Payer: Self-pay | Admitting: Family Medicine

## 2018-01-29 VITALS — BP 120/58 | HR 87 | Temp 98.2°F | Wt 237.4 lb

## 2018-01-29 DIAGNOSIS — G8929 Other chronic pain: Secondary | ICD-10-CM

## 2018-01-29 DIAGNOSIS — Z125 Encounter for screening for malignant neoplasm of prostate: Secondary | ICD-10-CM

## 2018-01-29 DIAGNOSIS — Z23 Encounter for immunization: Secondary | ICD-10-CM

## 2018-01-29 DIAGNOSIS — E785 Hyperlipidemia, unspecified: Secondary | ICD-10-CM | POA: Diagnosis not present

## 2018-01-29 DIAGNOSIS — I10 Essential (primary) hypertension: Secondary | ICD-10-CM

## 2018-01-29 DIAGNOSIS — I2510 Atherosclerotic heart disease of native coronary artery without angina pectoris: Secondary | ICD-10-CM

## 2018-01-29 DIAGNOSIS — M159 Polyosteoarthritis, unspecified: Secondary | ICD-10-CM

## 2018-01-29 DIAGNOSIS — R7302 Impaired glucose tolerance (oral): Secondary | ICD-10-CM | POA: Diagnosis not present

## 2018-01-29 DIAGNOSIS — M7521 Bicipital tendinitis, right shoulder: Secondary | ICD-10-CM | POA: Diagnosis not present

## 2018-01-29 DIAGNOSIS — M150 Primary generalized (osteo)arthritis: Secondary | ICD-10-CM

## 2018-01-29 DIAGNOSIS — M545 Low back pain: Secondary | ICD-10-CM

## 2018-01-29 DIAGNOSIS — J3489 Other specified disorders of nose and nasal sinuses: Secondary | ICD-10-CM

## 2018-01-29 DIAGNOSIS — M8949 Other hypertrophic osteoarthropathy, multiple sites: Secondary | ICD-10-CM

## 2018-01-29 LAB — COMPREHENSIVE METABOLIC PANEL
ALBUMIN: 4.4 g/dL (ref 3.5–5.2)
ALT: 31 U/L (ref 0–53)
AST: 18 U/L (ref 0–37)
Alkaline Phosphatase: 79 U/L (ref 39–117)
BILIRUBIN TOTAL: 1.4 mg/dL — AB (ref 0.2–1.2)
BUN: 15 mg/dL (ref 6–23)
CO2: 30 mEq/L (ref 19–32)
Calcium: 9.3 mg/dL (ref 8.4–10.5)
Chloride: 104 mEq/L (ref 96–112)
Creatinine, Ser: 0.78 mg/dL (ref 0.40–1.50)
GFR: 103.9 mL/min (ref >60.00)
GLUCOSE: 96 mg/dL (ref 70–99)
POTASSIUM: 4.4 meq/L (ref 3.5–5.1)
Sodium: 138 mEq/L (ref 135–145)
TOTAL PROTEIN: 6.7 g/dL (ref 6.0–8.3)

## 2018-01-29 LAB — CBC WITH DIFFERENTIAL/PLATELET
BASOS ABS: 0.1 10*3/uL (ref 0.0–0.1)
Basophils Relative: 0.9 % (ref 0.0–3.0)
EOS PCT: 2.2 % (ref 0.0–5.0)
Eosinophils Absolute: 0.1 10*3/uL (ref 0.0–0.7)
HCT: 46.5 % (ref 39.0–52.0)
HEMOGLOBIN: 15.5 g/dL (ref 13.0–17.0)
LYMPHS ABS: 2.1 10*3/uL (ref 0.7–4.0)
Lymphocytes Relative: 31.6 % (ref 12.0–46.0)
MCHC: 33.2 g/dL (ref 30.0–36.0)
MCV: 89.5 fl (ref 78.0–100.0)
MONO ABS: 0.7 10*3/uL (ref 0.1–1.0)
MONOS PCT: 10.3 % (ref 3.0–12.0)
NEUTROS PCT: 55 % (ref 43.0–77.0)
Neutro Abs: 3.6 10*3/uL (ref 1.4–7.7)
Platelets: 195 10*3/uL (ref 150.0–400.0)
RBC: 5.2 Mil/uL (ref 4.22–5.81)
RDW: 14.8 % (ref 11.5–15.5)
WBC: 6.5 10*3/uL (ref 4.0–10.5)

## 2018-01-29 LAB — LIPID PANEL
CHOLESTEROL: 120 mg/dL (ref 0–200)
HDL: 36.6 mg/dL — AB (ref >39.00)
LDL CALC: 67 mg/dL (ref 0–99)
NonHDL: 83.25
TRIGLYCERIDES: 82 mg/dL (ref 0.0–149.0)
Total CHOL/HDL Ratio: 3
VLDL: 16.4 mg/dL (ref 0.0–40.0)

## 2018-01-29 LAB — PSA: PSA: 3.45 ng/mL (ref 0.10–4.00)

## 2018-01-29 LAB — TSH: TSH: 2.06 u[IU]/mL (ref 0.35–4.50)

## 2018-01-29 LAB — HEMOGLOBIN A1C: Hgb A1c MFr Bld: 6.1 % (ref 4.6–6.5)

## 2018-01-29 MED ORDER — PREDNISONE 20 MG PO TABS: ORAL_TABLET | ORAL | 0 refills | Status: DC

## 2018-01-29 MED ORDER — FLUTICASONE PROPIONATE 50 MCG/ACT NA SUSP: 2.0000 | Freq: Every day | NASAL | 2 refills | Status: DC

## 2018-01-29 MED ORDER — ZOSTER VAC RECOMB ADJUVANTED 50 MCG/0.5ML IM SUSR: 0.5000 mL | Freq: Once | INTRAMUSCULAR | 1 refills | Status: AC
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
## 2018-01-30 ENCOUNTER — Telehealth: Payer: Self-pay

## 2018-01-30 NOTE — Telephone Encounter (Signed)
Sent through MyChart

## 2018-01-30 NOTE — Telephone Encounter (Signed)
-----   Message from Caren Macadam, MD sent at 01/29/2018  9:06 PM EDT ----- I forgot on bloodwork results to have you let him know since sugars looked good; I have sent in prednisone taper for him to try and see if we can improve the shoulder/bicep pain. Let me know if no improvement after this along with stretches for next 2-3 weeks.

## 2018-01-31 ENCOUNTER — Other Ambulatory Visit: Payer: Self-pay | Admitting: Internal Medicine

## 2018-01-31 NOTE — Telephone Encounter (Signed)
Dr.koberlien pt

## 2018-02-20 DIAGNOSIS — R69 Illness, unspecified: Secondary | ICD-10-CM | POA: Diagnosis not present

## 2018-03-05 DIAGNOSIS — D224 Melanocytic nevi of scalp and neck: Secondary | ICD-10-CM | POA: Diagnosis not present

## 2018-03-05 DIAGNOSIS — D1801 Hemangioma of skin and subcutaneous tissue: Secondary | ICD-10-CM | POA: Diagnosis not present

## 2018-03-05 DIAGNOSIS — C44519 Basal cell carcinoma of skin of other part of trunk: Secondary | ICD-10-CM | POA: Diagnosis not present

## 2018-03-05 DIAGNOSIS — D2262 Melanocytic nevi of left upper limb, including shoulder: Secondary | ICD-10-CM | POA: Diagnosis not present

## 2018-03-05 DIAGNOSIS — D225 Melanocytic nevi of trunk: Secondary | ICD-10-CM | POA: Diagnosis not present

## 2018-03-05 DIAGNOSIS — Z85828 Personal history of other malignant neoplasm of skin: Secondary | ICD-10-CM | POA: Diagnosis not present

## 2018-03-05 DIAGNOSIS — L853 Xerosis cutis: Secondary | ICD-10-CM | POA: Diagnosis not present

## 2018-03-05 DIAGNOSIS — L814 Other melanin hyperpigmentation: Secondary | ICD-10-CM | POA: Diagnosis not present

## 2018-03-05 DIAGNOSIS — L821 Other seborrheic keratosis: Secondary | ICD-10-CM | POA: Diagnosis not present

## 2018-03-05 DIAGNOSIS — L570 Actinic keratosis: Secondary | ICD-10-CM | POA: Diagnosis not present

## 2018-04-24 DIAGNOSIS — H52203 Unspecified astigmatism, bilateral: Secondary | ICD-10-CM | POA: Diagnosis not present

## 2018-04-24 DIAGNOSIS — H524 Presbyopia: Secondary | ICD-10-CM | POA: Diagnosis not present

## 2018-04-24 DIAGNOSIS — H25013 Cortical age-related cataract, bilateral: Secondary | ICD-10-CM | POA: Diagnosis not present

## 2018-04-24 DIAGNOSIS — H2513 Age-related nuclear cataract, bilateral: Secondary | ICD-10-CM | POA: Diagnosis not present

## 2018-06-23 ENCOUNTER — Other Ambulatory Visit: Payer: Self-pay | Admitting: Internal Medicine

## 2018-06-24 ENCOUNTER — Encounter: Payer: Self-pay | Admitting: Family Medicine

## 2018-06-25 NOTE — Telephone Encounter (Signed)
Please see if you can get more information:  What has he tried?  Is this worse than past? Duration? Additional symptoms?  Last time we discussed some chronic pain but he would occasionally have flares. I had made note of considering imaging/specialty eval if worsening, but since it has been 5 months since visit he would need another visit/assessment before I can order imaging anyway.   Let me know answers to above so that I can recommend where we need to go next with care. Office visit to discuss might make the most sense.

## 2018-06-29 ENCOUNTER — Telehealth: Payer: Self-pay

## 2018-06-29 NOTE — Telephone Encounter (Signed)
Author phoned pt. to assess interest in scheduling subsequent awv. No answer; author left detailed vm asking for return call.

## 2018-07-01 NOTE — Progress Notes (Signed)
[name] DOB: 1946-04-08 Encounter date: 07/02/2018  This is a 73 y.o. male who presents with Chief Complaint  Patient presents with  . Back Pain    x 1 week, dull aching pain, worse when moving, sharp pain in right hip, has been using wifes left over meloxicam    History of present illness:  Feeling better since starting meloxicam 7.5mg  daily. Symptoms worsened after messaging me on mychart.   Pain has been in belt line and hips for some time. Pain got more intense, but then a week ago started getting sharp shooting pain from right hip down to right knee. Neighbor saw him hobbling and told him he had sciatica. Thinks pain was radiating more in front of leg.  Started meloxicam last Tuesday q 12 hours, then last one he took was Friday night. Sharp pain stopped and hasn't restarted. Other pain is the same.   Also had fall on asphalt on left knee, elbow. Fell at work.   From time to time over last month would take a step and then feel like something wasn't lined up properly in right hip (primarily); but would adjust and feel like it corrected. 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 Getting in and out of all the different cars can be challenging for him. So day he fell he was on the lot and heading to car on rough, worn out asphalt when he turned to listen to someone while taking step. Got feet tangled and fell. Didn't tear pants or shirt but skinned the knee and elbow underneath. Hit elbow hard enough that it took a day or two for the feeling to come back. Cleansed with hydrogen peroxide for a couple of days.   At this point hip pain is just a constant pain; slightly worse than his baseline. Just thinking about it a little more. Hip pain flares up with bending.     Allergies  Allergen Reactions  . Iodine     REACTION: unspecified  . Lisinopril Cough   Current Meds  Medication Sig  . aspirin 81 MG tablet Take 81 mg by mouth daily. Pt takes 1- 81 mg  daily  . fluticasone (FLONASE) 50 MCG/ACT nasal spray Place 2 sprays into both nostrils daily.  Marland Kitchen ibuprofen (ADVIL,MOTRIN) 600 MG tablet Take 1 tablet (600 mg total) by mouth every 8 (eight) hours as needed. (Patient taking differently: Take 200 mg by mouth every 8 (eight) hours as needed. )  . losartan (COZAAR) 50 MG tablet TAKE 1 TABLET BY MOUTH EVERY DAY  . simvastatin (ZOCOR) 20 MG tablet TAKE 1 TABLET BY MOUTH EVERY DAY IN THE EVENING  . [DISCONTINUED] predniSONE (DELTASONE) 20 MG tablet Take 3 tabs PO x3 days, 2 tabs PO x 3 days, 1 tab PO x 3 days, then 0.5 tab PO x 2 days. Take in the morning and with food.   Current Facility-Administered Medications for the 07/02/18 encounter (Office Visit) with Caren Macadam, MD  Medication  . 0.9 %  sodium chloride infusion    Review of Systems  Constitutional: Negative for chills and fever.  Respiratory: Negative for cough.   Cardiovascular: Negative for leg swelling.  Musculoskeletal: Positive for arthralgias and back pain.  Skin: Positive for wound.    Objective:  BP 130/64 (BP Location: Left Arm, Patient Position: Sitting, Cuff Size: Normal)   Pulse 81   Temp 98.8 F (37.1 C) (Oral)   Ht 5\' 11"  (1.803 m)   Wt  241 lb 9.6 oz (109.6 kg)   SpO2 95%   BMI 33.70 kg/m   Weight: 241 lb 9.6 oz (109.6 kg)   BP Readings from Last 3 Encounters:  07/02/18 130/64  01/29/18 (!) 120/58  12/12/17 104/65   Wt Readings from Last 3 Encounters:  07/02/18 241 lb 9.6 oz (109.6 kg)  01/29/18 237 lb 6.4 oz (107.7 kg)  12/12/17 234 lb (106.1 kg)    Physical Exam Constitutional:      Appearance: Normal appearance. He is not ill-appearing.  Pulmonary:     Effort: Pulmonary effort is normal.  Musculoskeletal:     Comments: Lumbar bilat tenderness to palpation. There is tenderness with flexion, slight with extension. No increased pain or radiation of pain with extension/twist.   There is right SI tenderness. There is left greater trochanter  tenderness to palpation. There is right lower back/SI tenderness to external hip rotation. No significant tenderness with hip flexion.   Negative straight leg raise.   Neurological:     Mental Status: He is alert.     Deep Tendon Reflexes:     Reflex Scores:      Patellar reflexes are 2+ on the right side and 2+ on the left side.      Achilles reflexes are 2+ on the right side and 2+ on the left side.    Assessment/Plan  1. Pain of right sacroiliac joint We reviewed stretches that he can do at home for lower back. SI area, hip. OK to continue with meloxicam x 2 weeks. Do not take additional NSAIDs while on the meloxicam.   2. Chronic midline low back pain without sciatica See above. Most recent MRI from 2016 had significant disc disease, wear and tear. He actually functionally does well and tolerates his daily discomfort. Let us know if any worsening of pain. I do think he would benefit from PT to work on core support and flexibility. I would recommend he hold off on this until corona virus surge has calmed down but he can let me know if he would like to pursue this.  Return if symptoms worsen or fail to improve.      Micheline Rough, MD

## 2018-07-02 ENCOUNTER — Ambulatory Visit (INDEPENDENT_AMBULATORY_CARE_PROVIDER_SITE_OTHER): Payer: Medicare HMO | Admitting: Family Medicine

## 2018-07-02 ENCOUNTER — Other Ambulatory Visit: Payer: Self-pay

## 2018-07-02 ENCOUNTER — Encounter: Payer: Self-pay | Admitting: Family Medicine

## 2018-07-02 VITALS — BP 130/64 | HR 81 | Temp 98.8°F | Ht 71.0 in | Wt 241.6 lb

## 2018-07-02 DIAGNOSIS — M545 Low back pain, unspecified: Secondary | ICD-10-CM

## 2018-07-02 DIAGNOSIS — M533 Sacrococcygeal disorders, not elsewhere classified: Secondary | ICD-10-CM

## 2018-07-02 DIAGNOSIS — G8929 Other chronic pain: Secondary | ICD-10-CM | POA: Diagnosis not present

## 2018-07-02 MED ORDER — MELOXICAM 7.5 MG PO TABS: 7.5000 mg | ORAL_TABLET | Freq: Every day | ORAL | 2 refills | Status: DC

## 2018-07-02 NOTE — Patient Instructions (Signed)
Figure 4 stretches, belt under ball of foot stretches, and stomach - back stretches.   Let me know if any worsening of discomfort.   Trial of meloxicam 7.5mg  daily (ok to double as you have been if not noting improvement).   I'll call you with xrays.

## 2018-07-16 ENCOUNTER — Encounter: Payer: Self-pay | Admitting: Family Medicine

## 2018-07-30 ENCOUNTER — Other Ambulatory Visit: Payer: Self-pay

## 2018-07-30 MED ORDER — LOSARTAN POTASSIUM 50 MG PO TABS: 50.0000 mg | ORAL_TABLET | Freq: Every day | ORAL | 0 refills | Status: DC

## 2018-10-10 ENCOUNTER — Encounter: Payer: Self-pay | Admitting: Family Medicine

## 2018-10-11 MED ORDER — SIMVASTATIN 20 MG PO TABS: ORAL_TABLET | ORAL | 0 refills | Status: DC

## 2018-10-24 ENCOUNTER — Other Ambulatory Visit: Payer: Self-pay | Admitting: Family Medicine

## 2018-12-06 ENCOUNTER — Encounter: Payer: Self-pay | Admitting: Family Medicine

## 2018-12-07 ENCOUNTER — Telehealth (INDEPENDENT_AMBULATORY_CARE_PROVIDER_SITE_OTHER): Payer: Medicare HMO | Admitting: Adult Health

## 2018-12-07 ENCOUNTER — Other Ambulatory Visit: Payer: Self-pay

## 2018-12-07 ENCOUNTER — Encounter: Payer: Self-pay | Admitting: Family Medicine

## 2018-12-07 DIAGNOSIS — H8110 Benign paroxysmal vertigo, unspecified ear: Secondary | ICD-10-CM

## 2018-12-07 NOTE — Progress Notes (Signed)
Virtual Visit via Video Note  I connected with [name] on 12/07/18 at  3:30 PM EDT by a video enabled telemedicine application and verified that I am speaking with the correct person using two identifiers.  Location patient: home Location provider:work or home office Persons participating in the virtual visit: patient, provider  I discussed the limitations of evaluation and management by telemedicine and the availability of in person appointments. The patient expressed understanding and agreed to proceed.   HPI: 73 year old male who is being evaluated today for an acute issue.  Reports that yesterday morning he woke up feeling dizzy and unstable on his feet.  He reports that the room was spinning and the only way he could be comfortable with to lay on his back.  When he change positions he suddenly became dizzy again.  Reports having episodes of vertigo "30 years ago" and experienced the same symptoms.  Morning he started doing the Epley maneuver and has done it about 3 times today.  Since starting these exercises he has noticed significant difference in his symptoms.  Still feels dizzy and unstable but feels as though he is improving.  He has had no falls or syncopal episodes   ROS: See pertinent positives and negatives per HPI.  Past Medical History:  Diagnosis Date  . Allergy   . BACK PAIN, CHRONIC 03/12/2007  . Compression fracture of thoracic vertebra (HCC)    traumatic    T8  . CORONARY ARTERY DISEASE 08/18/2008  . HYPERLIPIDEMIA 08/18/2008  . HYPERTENSION 03/12/2007  . IGT (impaired glucose tolerance)   . OSTEOARTHRITIS 03/12/2007    Past Surgical History:  Procedure Laterality Date  . CARDIAC CATHETERIZATION  2010  . COLONOSCOPY    . HERNIA REPAIR     ingunial  . POLYPECTOMY      Family History  Problem Relation Age of Onset  . Diabetes Mother   . Cancer Mother        renal  . Renal cancer Mother   . Heart disease Father   . Stroke Father   . Diabetes Sister    . Colon cancer Neg Hx   . Colon polyps Neg Hx      Current Outpatient Medications:  .  aspirin 81 MG tablet, Take 81 mg by mouth daily. Pt takes 1- 81 mg daily, Disp: , Rfl:  .  fluticasone (FLONASE) 50 MCG/ACT nasal spray, Place 2 sprays into both nostrils daily., Disp: 16 g, Rfl: 2 .  ibuprofen (ADVIL,MOTRIN) 600 MG tablet, Take 1 tablet (600 mg total) by mouth every 8 (eight) hours as needed. (Patient taking differently: Take 200 mg by mouth every 8 (eight) hours as needed. ), Disp: 30 tablet, Rfl: 0 .  losartan (COZAAR) 50 MG tablet, TAKE 1 TABLET BY MOUTH EVERY DAY, Disp: 90 tablet, Rfl: 0 .  meloxicam (MOBIC) 7.5 MG tablet, Take 1-2 tablets (7.5-15 mg total) by mouth daily., Disp: 30 tablet, Rfl: 2 .  simvastatin (ZOCOR) 20 MG tablet, TAKE 1 TABLET BY MOUTH EVERY DAY IN THE EVENING, Disp: 90 tablet, Rfl: 0  Current Facility-Administered Medications:  .  0.9 %  sodium chloride infusion, 500 mL, Intravenous, Once, Armbruster, Carlota Raspberry, MD  EXAM:  VITALS per patient if applicable:  GENERAL: alert, oriented, appears well and in no acute distress  HEENT: atraumatic, conjunttiva clear, no obvious abnormalities on inspection of external nose and ears  NECK: normal movements of the head and neck  LUNGS: on inspection no signs of respiratory distress,  breathing rate appears normal, no obvious gross SOB, gasping or wheezing  CV: no obvious cyanosis  MS: moves all visible extremities without noticeable abnormality  PSYCH/NEURO: pleasant and cooperative, no obvious depression or anxiety, speech and thought processing grossly intact  ASSESSMENT AND PLAN:  Discussed the following assessment and plan:  1. Benign paroxysmal positional vertigo, unspecified laterality -Discussed medication such as meclizine.  He is improving with Epley maneuver he would like to continue with this instead of medication.  He was advised over the weekend if his symptoms have not resolved and he can try  using Dramamine.  - Follow up as needed     I discussed the assessment and treatment plan with the patient. The patient was provided an opportunity to ask questions and all were answered. The patient agreed with the plan and demonstrated an understanding of the instructions.   The patient was advised to call back or seek an in-person evaluation if the symptoms worsen or if the condition fails to improve as anticipated.   Dorothyann Peng, NP

## 2018-12-25 DIAGNOSIS — R69 Illness, unspecified: Secondary | ICD-10-CM | POA: Diagnosis not present

## 2018-12-29 ENCOUNTER — Encounter: Payer: Self-pay | Admitting: Family Medicine

## 2018-12-29 DIAGNOSIS — R69 Illness, unspecified: Secondary | ICD-10-CM | POA: Diagnosis not present

## 2019-01-03 ENCOUNTER — Other Ambulatory Visit: Payer: Self-pay | Admitting: Adult Health

## 2019-01-07 ENCOUNTER — Encounter: Payer: Self-pay | Admitting: Family Medicine

## 2019-01-08 ENCOUNTER — Encounter: Payer: Self-pay | Admitting: Family Medicine

## 2019-01-16 ENCOUNTER — Other Ambulatory Visit: Payer: Self-pay | Admitting: Family Medicine

## 2019-01-24 ENCOUNTER — Encounter: Payer: Self-pay | Admitting: Family Medicine

## 2019-03-12 ENCOUNTER — Other Ambulatory Visit: Payer: Self-pay

## 2019-03-12 DIAGNOSIS — Z85828 Personal history of other malignant neoplasm of skin: Secondary | ICD-10-CM | POA: Diagnosis not present

## 2019-03-12 DIAGNOSIS — D225 Melanocytic nevi of trunk: Secondary | ICD-10-CM | POA: Diagnosis not present

## 2019-03-12 DIAGNOSIS — D2262 Melanocytic nevi of left upper limb, including shoulder: Secondary | ICD-10-CM | POA: Diagnosis not present

## 2019-03-12 DIAGNOSIS — L821 Other seborrheic keratosis: Secondary | ICD-10-CM | POA: Diagnosis not present

## 2019-03-12 DIAGNOSIS — L814 Other melanin hyperpigmentation: Secondary | ICD-10-CM | POA: Diagnosis not present

## 2019-03-12 DIAGNOSIS — L853 Xerosis cutis: Secondary | ICD-10-CM | POA: Diagnosis not present

## 2019-03-12 DIAGNOSIS — L570 Actinic keratosis: Secondary | ICD-10-CM | POA: Diagnosis not present

## 2019-03-13 ENCOUNTER — Ambulatory Visit (INDEPENDENT_AMBULATORY_CARE_PROVIDER_SITE_OTHER): Payer: Medicare HMO | Admitting: Family Medicine

## 2019-03-13 ENCOUNTER — Encounter: Payer: Self-pay | Admitting: Family Medicine

## 2019-03-13 VITALS — BP 128/78 | HR 83 | Temp 97.2°F | Ht 71.5 in | Wt 240.4 lb

## 2019-03-13 DIAGNOSIS — M8949 Other hypertrophic osteoarthropathy, multiple sites: Secondary | ICD-10-CM

## 2019-03-13 DIAGNOSIS — G8929 Other chronic pain: Secondary | ICD-10-CM

## 2019-03-13 DIAGNOSIS — M545 Low back pain: Secondary | ICD-10-CM | POA: Diagnosis not present

## 2019-03-13 DIAGNOSIS — R7302 Impaired glucose tolerance (oral): Secondary | ICD-10-CM | POA: Diagnosis not present

## 2019-03-13 DIAGNOSIS — E785 Hyperlipidemia, unspecified: Secondary | ICD-10-CM | POA: Diagnosis not present

## 2019-03-13 DIAGNOSIS — Z0000 Encounter for general adult medical examination without abnormal findings: Secondary | ICD-10-CM

## 2019-03-13 DIAGNOSIS — M159 Polyosteoarthritis, unspecified: Secondary | ICD-10-CM

## 2019-03-13 DIAGNOSIS — I2510 Atherosclerotic heart disease of native coronary artery without angina pectoris: Secondary | ICD-10-CM

## 2019-03-13 DIAGNOSIS — I10 Essential (primary) hypertension: Secondary | ICD-10-CM

## 2019-03-13 LAB — COMPREHENSIVE METABOLIC PANEL
ALT: 33 U/L (ref 0–53)
AST: 23 U/L (ref 0–37)
Albumin: 4.2 g/dL (ref 3.5–5.2)
Alkaline Phosphatase: 88 U/L (ref 39–117)
BUN: 13 mg/dL (ref 6–23)
CO2: 27 mEq/L (ref 19–32)
Calcium: 9.2 mg/dL (ref 8.4–10.5)
Chloride: 104 mEq/L (ref 96–112)
Creatinine, Ser: 0.77 mg/dL (ref 0.40–1.50)
GFR: 98.91 mL/min (ref >60.00)
Glucose, Bld: 104 mg/dL — ABNORMAL HIGH (ref 70–99)
Potassium: 4.4 mEq/L (ref 3.5–5.1)
Sodium: 138 mEq/L (ref 135–145)
Total Bilirubin: 1.3 mg/dL — ABNORMAL HIGH (ref 0.2–1.2)
Total Protein: 6.6 g/dL (ref 6.0–8.3)

## 2019-03-13 LAB — PSA: PSA: 3.96 ng/mL (ref 0.10–4.00)

## 2019-03-13 LAB — CBC WITH DIFFERENTIAL/PLATELET
Basophils Absolute: 0.1 10*3/uL (ref 0.0–0.1)
Basophils Relative: 0.9 % (ref 0.0–3.0)
Eosinophils Absolute: 0.1 10*3/uL (ref 0.0–0.7)
Eosinophils Relative: 2.2 % (ref 0.0–5.0)
HCT: 44.9 % (ref 39.0–52.0)
Hemoglobin: 14.8 g/dL (ref 13.0–17.0)
Lymphocytes Relative: 34.2 % (ref 12.0–46.0)
Lymphs Abs: 2.2 10*3/uL (ref 0.7–4.0)
MCHC: 33 g/dL (ref 30.0–36.0)
MCV: 89.9 fl (ref 78.0–100.0)
Monocytes Absolute: 0.6 10*3/uL (ref 0.1–1.0)
Monocytes Relative: 8.5 % (ref 3.0–12.0)
Neutro Abs: 3.5 10*3/uL (ref 1.4–7.7)
Neutrophils Relative %: 54.2 % (ref 43.0–77.0)
Platelets: 202 10*3/uL (ref 150.0–400.0)
RBC: 4.99 Mil/uL (ref 4.22–5.81)
RDW: 13.8 % (ref 11.5–15.5)
WBC: 6.5 10*3/uL (ref 4.0–10.5)

## 2019-03-13 LAB — LIPID PANEL
Cholesterol: 130 mg/dL (ref 0–200)
HDL: 34.9 mg/dL — ABNORMAL LOW (ref >39.00)
LDL Cholesterol: 75 mg/dL (ref 0–99)
NonHDL: 94.6
Total CHOL/HDL Ratio: 4
Triglycerides: 100 mg/dL (ref 0.0–149.0)
VLDL: 20 mg/dL (ref 0.0–40.0)

## 2019-03-13 LAB — HEMOGLOBIN A1C: Hgb A1c MFr Bld: 6.2 % (ref 4.6–6.5)

## 2019-03-13 MED ORDER — SHINGRIX 50 MCG/0.5ML IM SUSR: 0.5000 mL | Freq: Once | INTRAMUSCULAR | 0 refills | Status: AC

## 2019-03-13 NOTE — Patient Instructions (Addendum)
Work on that daily exercise.   Why is Exercise Important? If I told you I had a single pill that would help you decrease stress by improving anxiety, decreasing depression, help you achieve a healthy weight, give you more energy, make you more productive, help you focus, decrease your risk of dementia/heart attack/stroke/falls, improve your bone health, and more would you be interested? These are just some of the benefits that exercise brings to you. IT IS WORTH carving out some time every day to fit in exercise. It will help in every aspect of your health. Even if you have injuries that prevent you from participating in a type of exercise you used to do; there is always something that you can do to keep exercise a part of your life. If improving your health is important, make exercise your priority. It is worth the time! If you have questions about the type of exercise that is right for you, please talk with me about this!     Exercising to Stay Healthy  Exercising regularly is important. It has many health benefits, such as:  Improving your overall fitness, flexibility, and endurance.  Increasing your bone density.  Helping with weight control.  Decreasing your body fat.  Increasing your muscle strength.  Reducing stress and tension.  Improving your overall health.   In order to become healthy and stay healthy, it is recommended that you do moderate-intensity and vigorous-intensity exercise. You can tell that you are exercising at a moderate intensity if you have a higher heart rate and faster breathing, but you are still able to hold a conversation. You can tell that you are exercising at a vigorous intensity if you are breathing much harder and faster and cannot hold a conversation while exercising. How often should I exercise? Choose an activity that you enjoy and set realistic goals. Your health care provider can help you to make an activity plan that works for you. Exercise  regularly as directed by your health care provider. This may include:  Doing resistance training twice each week, such as: ? Push-ups. ? Sit-ups. ? Lifting weights. ? Using resistance bands.  Doing a given intensity of exercise for a given amount of time. Choose from these options: ? 150 minutes of moderate-intensity exercise every week. ? 75 minutes of vigorous-intensity exercise every week. ? A mix of moderate-intensity and vigorous-intensity exercise every week.   Children, pregnant women, people who are out of shape, people who are overweight, and older adults may need to consult a health care provider for individual recommendations. If you have any sort of medical condition, be sure to consult your health care provider before starting a new exercise program. What are some exercise ideas? Some moderate-intensity exercise ideas include:  Walking at a rate of 1 mile in 15 minutes.  Biking.  Hiking.  Golfing.  Dancing.   Some vigorous-intensity exercise ideas include:  Walking at a rate of at least 4.5 miles per hour.  Jogging or running at a rate of 5 miles per hour.  Biking at a rate of at least 10 miles per hour.  Lap swimming.  Roller-skating or in-line skating.  Cross-country skiing.  Vigorous competitive sports, such as football, basketball, and soccer.  Jumping rope.  Aerobic dancing.   What are some everyday activities that can help me to get exercise?  [place] work, such as: ? Pushing a Conservation officer, nature. ? Raking and bagging leaves.  Washing and waxing your car.  Pushing a stroller.  Shoveling  snow.  Gardening.  Washing windows or floors. How can I be more active in my day-to-day activities?  Use the stairs instead of the elevator.  Take a walk during your lunch break.  If you drive, park your car farther away from work or school.  If you take public transportation, get off one stop early and walk the rest of the way.  Make all of your phone  calls while standing up and walking around.  Get up, stretch, and walk around every 30 minutes throughout the day. What guidelines should I follow while exercising?  Do not exercise so much that you hurt yourself, feel dizzy, or get very short of breath.  Consult your health care provider before starting a new exercise program.  Wear comfortable clothes and shoes with good support.  Drink plenty of water while you exercise to prevent dehydration or heat stroke. Body water is lost during exercise and must be replaced.  Work out until you breathe faster and your heart beats faster. This information is not intended to replace advice given to you by your health care provider. Make sure you discuss any questions you have with your health care provider.

## 2019-03-13 NOTE — Progress Notes (Signed)
[name] DOB: 01-Jun-1945 Encounter date: 03/13/2019  This is a 73 y.o. male who presents for complete physical   History of present illness/Additional concerns:  No specific concerns today. Still wakes with headache sometimes (wouldn't say frequently). Used to think sinuses, but nose has clear drainage. Notes more seasonal - notes in fall/spring. Fine once he gets through the season. Is taking the baby asa and has been taking the advil pm - not sure if he feels better sleeping.   HTN: on losartan. Doesn't check at home.   HL: on simvastatin; doing ok with this.    Last year we discussed some concerns with prostate cancer and he wanted to do screening for this.   Back pain: Has been about the same. Not any worse. 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   CAD: bp is controlled; takes ASA 80m daily.  Colon polyps: recent colonoscopy 12/12/17 with rec to repeat in 3 years due to adenomatous polyps.   Past Medical History:  Diagnosis Date  . Allergy   . BACK PAIN, CHRONIC 03/12/2007  . Compression fracture of thoracic vertebra (HCC)    traumatic    T8  . CORONARY ARTERY DISEASE 08/18/2008  . HYPERLIPIDEMIA 08/18/2008  . HYPERTENSION 03/12/2007  . IGT (impaired glucose tolerance)   . OSTEOARTHRITIS 03/12/2007   Past Surgical History:  Procedure Laterality Date  . CARDIAC CATHETERIZATION  2010  . COLONOSCOPY    . HERNIA REPAIR     ingunial  . POLYPECTOMY     Allergies  Allergen Reactions  . Iodine     REACTION: unspecified  . Lisinopril Cough   Current Meds  Medication Sig  . aspirin 81 MG tablet Take 81 mg by mouth daily. Pt takes 1- 81 mg daily  . fluticasone (FLONASE) 50 MCG/ACT nasal spray Place 2 sprays into both nostrils daily.  . Ibuprofen-diphenhydrAMINE Cit (ADVIL PM PO) Take by mouth.  . losartan (COZAAR) 50 MG tablet TAKE 1 TABLET BY MOUTH EVERY DAY  . simvastatin (ZOCOR) 20 MG tablet TAKE 1 TABLET BY MOUTH EVERY DAY IN THE EVENING    Current Facility-Administered Medications for the 03/13/19 encounter (Office Visit) with KCaren Macadam MD  Medication  . 0.9 %  sodium chloride infusion   Social History   Tobacco Use  . Smoking status: Former Smoker    Quit date: 04/18/1970    Years since quitting: 48.9  . Smokeless tobacco: Never Used  . Tobacco comment: smoked 3 yrs- 1968-1971  Substance Use Topics  . Alcohol use: Yes    Alcohol/week: 14.0 standard drinks    Types: 7 Glasses of wine, 7 Standard drinks or equivalent per week   Family History  Problem Relation Age of Onset  . Diabetes Mother   . Cancer Mother        renal  . Renal cancer Mother   . Heart disease Father   . Stroke Father   . Diabetes Sister   . Colon cancer Neg Hx   . Colon polyps Neg Hx      Review of Systems  Constitutional: Negative for activity change, appetite change, chills, fatigue, fever and unexpected weight change.  HENT: Negative for congestion, ear pain, hearing loss, sinus pressure, sinus pain, sore throat and trouble swallowing.   Eyes: Negative for pain and visual disturbance.  Respiratory: Negative for cough, chest tightness, shortness of breath and wheezing.   Cardiovascular: Negative for chest pain, palpitations and leg swelling.  Gastrointestinal: Negative for abdominal  distention, abdominal pain, blood in stool, constipation, diarrhea, nausea and vomiting.  Genitourinary: Negative for decreased urine volume, difficulty urinating, dysuria, penile pain and testicular pain.  Musculoskeletal: Positive for back pain and joint swelling. Negative for arthralgias.  Skin: Negative for rash.  Neurological: Negative for dizziness, weakness, numbness and headaches.  Hematological: Negative for adenopathy. Does not bruise/bleed easily.  Psychiatric/Behavioral: Negative for agitation, sleep disturbance and suicidal ideas. The patient is not nervous/anxious.     CBC:  Lab Results  Component Value Date   WBC 6.5  01/29/2018   HGB 15.5 01/29/2018   HCT 46.5 01/29/2018   MCHC 33.2 01/29/2018   RDW 14.8 01/29/2018   PLT 195.0 01/29/2018   CMP: Lab Results  Component Value Date   NA 138 01/29/2018   K 4.4 01/29/2018   CL 104 01/29/2018   CO2 30 01/29/2018   GLUCOSE 96 01/29/2018   BUN 15 01/29/2018   CREATININE 0.78 01/29/2018   GFRAA  08/01/2008    >60        The eGFR has been calculated using the MDRD equation. This calculation has not been validated in all clinical situations. eGFR's persistently <60 mL/min signify possible Chronic Kidney Disease.   CALCIUM 9.3 01/29/2018   PROT 6.7 01/29/2018   BILITOT 1.4 (H) 01/29/2018   ALKPHOS 79 01/29/2018   ALT 31 01/29/2018   AST 18 01/29/2018   LIPID: Lab Results  Component Value Date   CHOL 120 01/29/2018   TRIG 82.0 01/29/2018   HDL 36.60 (L) 01/29/2018   LDLCALC 67 01/29/2018    Objective:  BP 128/78 (BP Location: Left Arm, Patient Position: Sitting, Cuff Size: Large)   Pulse 83   Temp (!) 97.2 F (36.2 C) (Temporal)   Ht 5' 11.5" (1.816 m)   Wt 240 lb 6.4 oz (109 kg)   SpO2 98%   BMI 33.06 kg/m   Weight: 240 lb 6.4 oz (109 kg)   BP Readings from Last 3 Encounters:  03/13/19 128/78  07/02/18 130/64  01/29/18 (!) 120/58   Wt Readings from Last 3 Encounters:  03/13/19 240 lb 6.4 oz (109 kg)  07/02/18 241 lb 9.6 oz (109.6 kg)  01/29/18 237 lb 6.4 oz (107.7 kg)    Physical Exam Constitutional:      General: He is not in acute distress.    Appearance: He is well-developed.  HENT:     Head: Normocephalic and atraumatic.     Right Ear: External ear normal.     Left Ear: External ear normal.     Nose: Nose normal.     Mouth/Throat:     Pharynx: No oropharyngeal exudate.  Eyes:     Conjunctiva/sclera: Conjunctivae normal.     Pupils: Pupils are equal, round, and reactive to light.  Neck:     Musculoskeletal: Neck supple.     Thyroid: No thyromegaly.  Cardiovascular:     Rate and Rhythm: Normal rate and  regular rhythm.     Heart sounds: Normal heart sounds. No murmur. No friction rub. No gallop.   Pulmonary:     Effort: Pulmonary effort is normal. No respiratory distress.     Breath sounds: Normal breath sounds. No stridor. No wheezing or rales.  Abdominal:     General: Bowel sounds are normal.     Palpations: Abdomen is soft.  Musculoskeletal: Normal range of motion.  Skin:    General: Skin is warm and dry.  Neurological:     Mental Status: He is  alert and oriented to person, place, and time.  Psychiatric:        Behavior: Behavior normal.        Thought Content: Thought content normal.        Judgment: Judgment normal.     Assessment/Plan: Health Maintenance Due  Topic Date Due  . Hepatitis C Screening  Apr 13, 1946   Health Maintenance reviewed.  1. Preventative health care Work on daily exercise - PSA; Future - Zoster Vaccine Adjuvanted [facility]) injection; Inject 0.5 mLs into the muscle once for 1 dose. Repeat in 2-6 months  Dispense: 0.5 mL; Refill: 0  2. Essential hypertension Stable; continue current medication - CBC with Differential/Platelet; Future - Comprehensive metabolic panel; Future  3. Atherosclerosis of native coronary artery of native heart without angina pectoris Continue with aspirin.   4. Impaired glucose tolerance - Hemoglobin A1c; Future  5. Primary osteoarthritis involving multiple joints Stable now. Going to work on regular exercise.   6. Dyslipidemia Continue simvastatin.  - Comprehensive metabolic panel; Future - Lipid panel; Future  7. Chronic low back pain, unspecified back pain laterality, unspecified whether sciatica present Stable.   Return in about 6 months (around 09/10/2019) for Chronic condition visit.  Micheline Rough, MD

## 2019-03-13 NOTE — Addendum Note (Signed)
Addended by: Suzette Battiest on: 03/13/2019 08:46 AM   Modules accepted: Orders

## 2019-03-18 ENCOUNTER — Encounter: Payer: Self-pay | Admitting: Family Medicine

## 2019-04-16 ENCOUNTER — Other Ambulatory Visit: Payer: Self-pay | Admitting: Family Medicine

## 2019-04-18 ENCOUNTER — Other Ambulatory Visit: Payer: Self-pay | Admitting: *Deleted

## 2019-04-18 MED ORDER — FLUTICASONE PROPIONATE 50 MCG/ACT NA SUSP: 2.0000 | Freq: Every day | NASAL | 2 refills | Status: DC

## 2019-04-18 NOTE — Telephone Encounter (Signed)
Rx done. 

## 2019-04-29 DIAGNOSIS — H2513 Age-related nuclear cataract, bilateral: Secondary | ICD-10-CM | POA: Diagnosis not present

## 2019-04-29 DIAGNOSIS — H25041 Posterior subcapsular polar age-related cataract, right eye: Secondary | ICD-10-CM | POA: Diagnosis not present

## 2019-04-29 DIAGNOSIS — H5203 Hypermetropia, bilateral: Secondary | ICD-10-CM | POA: Diagnosis not present

## 2019-04-29 DIAGNOSIS — H35372 Puckering of macula, left eye: Secondary | ICD-10-CM | POA: Diagnosis not present

## 2019-05-07 ENCOUNTER — Ambulatory Visit: Payer: Medicare Other | Attending: Internal Medicine

## 2019-05-07 DIAGNOSIS — Z23 Encounter for immunization: Secondary | ICD-10-CM

## 2019-05-07 NOTE — Progress Notes (Signed)
   Covid-[facility] Vaccination [facility]  Name:  Hardwick Streifel    MRN: [id number] DOB: 1946-02-24  05/07/2019  Mr. Veronica was observed post Covid-19 immunization for 15 minutes without incidence. He was provided with Vaccine Information Sheet and instruction to access the V-Safe system.   Mr. Bonifield was instructed to call 911 with any severe reactions post vaccine: Marland Kitchen Difficulty breathing  . Swelling of your face and throat  . A fast heartbeat  . A bad rash all over your body  . Dizziness and weakness    Immunizations Administered    Name Date Dose VIS Date Route   Pfizer COVID-19 Vaccine 05/07/2019 12:02 PM 0.3 mL 03/29/2019 Intramuscular   Manufacturer: [place]   Lot: [id number]   [place]: [id number]

## 2019-05-24 DIAGNOSIS — Z20828 Contact with and (suspected) exposure to other viral communicable diseases: Secondary | ICD-10-CM | POA: Diagnosis not present

## 2019-05-26 ENCOUNTER — Encounter: Payer: Self-pay | Admitting: Family Medicine

## 2019-05-27 ENCOUNTER — Other Ambulatory Visit: Payer: Self-pay

## 2019-05-27 ENCOUNTER — Other Ambulatory Visit: Payer: Self-pay | Admitting: Family Medicine

## 2019-05-27 ENCOUNTER — Telehealth (INDEPENDENT_AMBULATORY_CARE_PROVIDER_SITE_OTHER): Payer: Medicare HMO | Admitting: Family Medicine

## 2019-05-27 DIAGNOSIS — R05 Cough: Secondary | ICD-10-CM

## 2019-05-27 DIAGNOSIS — R059 Cough, unspecified: Secondary | ICD-10-CM

## 2019-05-27 MED ORDER — BENZONATATE 100 MG PO CAPS: ORAL_CAPSULE | ORAL | 0 refills | Status: DC

## 2019-05-27 MED ORDER — HYDROCODONE-HOMATROPINE 5-1.5 MG/5ML PO SYRP: 5.0000 mL | ORAL_SOLUTION | Freq: Four times a day (QID) | ORAL | 0 refills | Status: DC | PRN

## 2019-05-27 NOTE — Telephone Encounter (Signed)
I sent in cough syrup for him, but he needs a virtual visit to discuss symptoms just in case something else is going on contributing to cough. Please make sure otherwise feeling well (no heart racing, chest discomfort).

## 2019-05-27 NOTE — Telephone Encounter (Signed)
Patient called back and was informed of the message below.  Patient stated he knows sinus drainage down the back of his throat is causing the cough.  Stated he has used Mucinex D for the past 3 weeks and the cough comes back.  Virtual visit scheduled for today at 4pm with Dr Elease Hashimoto.

## 2019-05-27 NOTE — Progress Notes (Signed)
This visit type was conducted due to national recommendations for restrictions regarding the COVID-19 pandemic in an effort to limit this patient's exposure and mitigate transmission in our community.   Virtual Visit via Video Note  I connected with [name] on 05/27/19 at  4:00 PM EST by a video enabled telemedicine application and verified that I am speaking with the correct person using two identifiers.  Location patient: home Location provider:work or home office Persons participating in the virtual visit: patient, provider  I discussed the limitations of evaluation and management by telemedicine and the availability of in person appointments. The patient expressed understanding and agreed to proceed.   HPI:  [name] called with roughly 2-week history of cough.  He went for Covid testing on Friday and this came back negative.  He states he tends have similar cough about this time every year.  He feels this is stemming from postnasal drip.  He describes some clear postnasal drip symptoms.  No fever.  No sore throat.  No facial pain.  No headaches.  He has taken several over-the-counter cough medications including Mucinex DM without much relief.  He actually slept fairly well last night but is had some nighttime cough and some daytime cough as well.  No hemoptysis.  No chronic lung disease  ROS: See pertinent positives and negatives per HPI.  Past Medical History:  Diagnosis Date  . Allergy   . BACK PAIN, CHRONIC 03/12/2007  . Compression fracture of thoracic vertebra (HCC)    traumatic    T8  . CORONARY ARTERY DISEASE 08/18/2008  . HYPERLIPIDEMIA 08/18/2008  . HYPERTENSION 03/12/2007  . IGT (impaired glucose tolerance)   . OSTEOARTHRITIS 03/12/2007    Past Surgical History:  Procedure Laterality Date  . CARDIAC CATHETERIZATION  2010  . COLONOSCOPY    . HERNIA REPAIR     ingunial  . POLYPECTOMY      Family History  Problem Relation Age of Onset  . Diabetes Mother   .  Cancer Mother        renal  . Renal cancer Mother   . Heart disease Father   . Stroke Father   . Diabetes Sister   . Colon cancer Neg Hx   . Colon polyps Neg Hx     SOCIAL HX: Quit smoking 1972   Current Outpatient Medications:  .  aspirin 81 MG tablet, Take 81 mg by mouth daily. Pt takes 1- 81 mg daily, Disp: , Rfl:  .  benzonatate (TESSALON PERLES) 100 MG capsule, Take one to two capsules every 8 hours as needed for cough., Disp: 40 capsule, Rfl: 0 .  fluticasone (FLONASE) 50 MCG/ACT nasal spray, Place 2 sprays into both nostrils daily., Disp: 16 g, Rfl: 2 .  HYDROcodone-homatropine (HYDROMET) 5-1.5 MG/5ML syrup, Take 5 mLs by mouth every 6 (six) hours as needed., Disp: 100 mL, Rfl: 0 .  Ibuprofen-diphenhydrAMINE Cit (ADVIL PM PO), Take by mouth., Disp: , Rfl:  .  losartan (COZAAR) 50 MG tablet, TAKE 1 TABLET BY MOUTH EVERY DAY, Disp: 90 tablet, Rfl: 1 .  simvastatin (ZOCOR) 20 MG tablet, TAKE 1 TABLET BY MOUTH EVERY DAY IN THE EVENING, Disp: 90 tablet, Rfl: 1  Current Facility-Administered Medications:  .  0.9 %  sodium chloride infusion, 500 mL, Intravenous, Once, Armbruster, Carlota Raspberry, MD  EXAM:  VITALS per patient if applicable:  GENERAL: alert, oriented, appears well and in no acute distress  HEENT: atraumatic, conjunttiva clear, no obvious abnormalities on inspection of  external nose and ears  NECK: normal movements of the head and neck  LUNGS: on inspection no signs of respiratory distress, breathing rate appears normal, no obvious gross SOB, gasping or wheezing  CV: no obvious cyanosis  MS: moves all visible extremities without noticeable abnormality  PSYCH/NEURO: pleasant and cooperative, no obvious depression or anxiety, speech and thought processing grossly intact  ASSESSMENT AND PLAN:  Discussed the following assessment and plan:  Dry cough.  Recent Covid test negative.  Patient in no respiratory distress.  Possibly postnasal drip related.  We suggested the  following  -Consider over-the-counter chlorpheniramine 4 mg nightly to help postnasal drip symptoms -Tessalon Perles 100 mg 1-2 every 8 hours as needed for cough -Follow-up promptly for any fever, increased shortness of breath, or other concerns     I discussed the assessment and treatment plan with the patient. The patient was provided an opportunity to ask questions and all were answered. The patient agreed with the plan and demonstrated an understanding of the instructions.   The patient was advised to call back or seek an in-person evaluation if the symptoms worsen or if the condition fails to improve as anticipated.     Carolann Littler, MD

## 2019-05-28 ENCOUNTER — Ambulatory Visit: Payer: Medicare Other | Attending: Internal Medicine

## 2019-05-28 DIAGNOSIS — Z23 Encounter for immunization: Secondary | ICD-10-CM

## 2019-05-28 NOTE — Progress Notes (Signed)
   Covid-19 Vaccination Clinic  Name:  [name]    MRN: [id number] DOB: 20-May-1945  05/28/2019  [name] was observed post Covid-19 immunization for 15 minutes without incidence. He was provided with Vaccine Information Sheet and instruction to access the V-Safe system.   [name] was instructed to call 911 with any severe reactions post vaccine: Marland Kitchen Difficulty breathing  . Swelling of your face and throat  . A fast heartbeat  . A bad rash all over your body  . Dizziness and weakness    Immunizations Administered    Name Date Dose VIS Date Route   Pfizer COVID-19 Vaccine 05/28/2019 12:24 PM 0.3 mL 03/29/2019 Intramuscular   Manufacturer: [place]   Lot: [id number]   [place]: [id number]

## 2019-06-05 ENCOUNTER — Ambulatory Visit: Payer: Medicare HMO

## 2019-07-02 ENCOUNTER — Other Ambulatory Visit: Payer: Self-pay | Admitting: Family Medicine

## 2019-07-10 DIAGNOSIS — R69 Illness, unspecified: Secondary | ICD-10-CM | POA: Diagnosis not present

## 2019-07-12 ENCOUNTER — Other Ambulatory Visit: Payer: Self-pay | Admitting: Family Medicine

## 2019-08-20 DIAGNOSIS — H2511 Age-related nuclear cataract, right eye: Secondary | ICD-10-CM | POA: Diagnosis not present

## 2019-08-20 DIAGNOSIS — H25011 Cortical age-related cataract, right eye: Secondary | ICD-10-CM | POA: Diagnosis not present

## 2019-08-20 DIAGNOSIS — H25811 Combined forms of age-related cataract, right eye: Secondary | ICD-10-CM | POA: Diagnosis not present

## 2019-09-18 ENCOUNTER — Telehealth: Payer: Self-pay | Admitting: Family Medicine

## 2019-09-18 NOTE — Progress Notes (Signed)
  Chronic Care Management   Outreach Note  09/18/2019 Name: Yeison Gago MRN: [id number] DOB: 09-Jun-1945  Referred by: Caren Macadam, MD Reason for referral : No chief complaint on file.   An unsuccessful telephone outreach was attempted today. The patient was referred to the pharmacist for assistance with care management and care coordination.  This note is not being shared with the patient for the following reason: To respect privacy (The patient or proxy has requested that the information not be shared).   Follow Up Plan:   [place]

## 2019-10-01 DIAGNOSIS — H2512 Age-related nuclear cataract, left eye: Secondary | ICD-10-CM | POA: Diagnosis not present

## 2019-10-05 ENCOUNTER — Other Ambulatory Visit: Payer: Self-pay | Admitting: Family Medicine

## 2019-10-08 ENCOUNTER — Other Ambulatory Visit: Payer: Self-pay | Admitting: Family Medicine

## 2019-10-22 DIAGNOSIS — R69 Illness, unspecified: Secondary | ICD-10-CM | POA: Diagnosis not present

## 2019-10-30 DIAGNOSIS — H35353 Cystoid macular degeneration, bilateral: Secondary | ICD-10-CM | POA: Diagnosis not present

## 2019-11-20 DIAGNOSIS — R69 Illness, unspecified: Secondary | ICD-10-CM | POA: Diagnosis not present

## 2019-12-04 DIAGNOSIS — H35353 Cystoid macular degeneration, bilateral: Secondary | ICD-10-CM | POA: Diagnosis not present

## 2019-12-26 ENCOUNTER — Other Ambulatory Visit: Payer: Self-pay | Admitting: Family Medicine

## 2019-12-30 ENCOUNTER — Other Ambulatory Visit: Payer: Self-pay | Admitting: Family Medicine

## 2020-01-01 DIAGNOSIS — R69 Illness, unspecified: Secondary | ICD-10-CM | POA: Diagnosis not present

## 2020-01-29 DIAGNOSIS — R69 Illness, unspecified: Secondary | ICD-10-CM | POA: Diagnosis not present

## 2020-02-25 DIAGNOSIS — H5203 Hypermetropia, bilateral: Secondary | ICD-10-CM | POA: Diagnosis not present

## 2020-02-25 DIAGNOSIS — H52223 Regular astigmatism, bilateral: Secondary | ICD-10-CM | POA: Diagnosis not present

## 2020-02-25 DIAGNOSIS — H524 Presbyopia: Secondary | ICD-10-CM | POA: Diagnosis not present

## 2020-02-29 DIAGNOSIS — Z0100 Encounter for examination of eyes and vision without abnormal findings: Secondary | ICD-10-CM | POA: Diagnosis not present

## 2020-03-10 DIAGNOSIS — L853 Xerosis cutis: Secondary | ICD-10-CM | POA: Diagnosis not present

## 2020-03-10 DIAGNOSIS — L565 Disseminated superficial actinic porokeratosis (DSAP): Secondary | ICD-10-CM | POA: Diagnosis not present

## 2020-03-10 DIAGNOSIS — Z85828 Personal history of other malignant neoplasm of skin: Secondary | ICD-10-CM | POA: Diagnosis not present

## 2020-03-10 DIAGNOSIS — D2271 Melanocytic nevi of right lower limb, including hip: Secondary | ICD-10-CM | POA: Diagnosis not present

## 2020-03-10 DIAGNOSIS — L821 Other seborrheic keratosis: Secondary | ICD-10-CM | POA: Diagnosis not present

## 2020-03-10 DIAGNOSIS — L814 Other melanin hyperpigmentation: Secondary | ICD-10-CM | POA: Diagnosis not present

## 2020-03-17 ENCOUNTER — Telehealth: Payer: Self-pay | Admitting: Family Medicine

## 2020-03-17 NOTE — Telephone Encounter (Signed)
Left message for patient to call back and schedule Medicare Annual Wellness Visit (AWV) either virtually or in office.   Last AWV 03/25/2016 ; please schedule at anytime with [facility] Nurse Health Advisor 1 or 2   This should be a 45 minute visit.

## 2020-03-18 ENCOUNTER — Ambulatory Visit (INDEPENDENT_AMBULATORY_CARE_PROVIDER_SITE_OTHER): Payer: Medicare HMO

## 2020-03-18 DIAGNOSIS — Z0000 Encounter for general adult medical examination without abnormal findings: Secondary | ICD-10-CM | POA: Diagnosis not present

## 2020-03-18 NOTE — Patient Instructions (Signed)
[name] , Thank you for taking time to come for your Medicare Wellness Visit. I appreciate your ongoing commitment to your health goals. Please review the following plan we discussed and let me know if I can assist you in the future.   Screening recommendations/referrals: Colonoscopy: Up to date, next due 12/12/2020 Recommended yearly ophthalmology/optometry visit for glaucoma screening and checkup Recommended yearly dental visit for hygiene and checkup  Vaccinations: Influenza vaccine: Up to date, next due fall 2022 Pneumococcal vaccine: Completed series  Tdap vaccine: Up to date, next due 09/08/2011 Shingles vaccine: Currently due for Shingrix, if you wish to receive we recommend that you do so at your pharmacy as it is less expensive     Advanced directives: Please bring in copies of your Advanced Medical directives into our office so that we may scan it into your chart.  Conditions/risks identified: None   Next appointment: 04/08/2020 @ 11:00 am with Dr. Ethlyn Gallery   Preventive Care 74 Years and Older, Male Preventive care refers to lifestyle choices and visits with your health care provider that can promote health and wellness. What does preventive care include?  A yearly physical exam. This is also called an annual well check.  Dental exams once or twice a year.  Routine eye exams. Ask your health care provider how often you should have your eyes checked.  Personal lifestyle choices, including:  Daily care of your teeth and gums.  Regular physical activity.  Eating a healthy diet.  Avoiding tobacco and drug use.  Limiting alcohol use.  Practicing safe sex.  Taking low doses of aspirin every day.  Taking vitamin and mineral supplements as recommended by your health care provider. What happens during an annual well check? The services and screenings done by your health care provider during your annual well check will depend on your age, overall health, lifestyle  risk factors, and family history of disease. Counseling  Your health care provider may ask you questions about your:  Alcohol use.  Tobacco use.  Drug use.  Emotional well-being.  Home and relationship well-being.  Sexual activity.  Eating habits.  History of falls.  Memory and ability to understand (cognition).  Work and work Statistician. Screening  You may have the following tests or measurements:  Height, weight, and BMI.  Blood pressure.  Lipid and cholesterol levels. These may be checked every 5 years, or more frequently if you are over 47 years old.  Skin check.  Lung cancer screening. You may have this screening every year starting at age 71 if you have a 30-pack-year history of smoking and currently smoke or have quit within the past 15 years.  Fecal occult blood test (FOBT) of the stool. You may have this test every year starting at age 36.  Flexible sigmoidoscopy or colonoscopy. You may have a sigmoidoscopy every 5 years or a colonoscopy every 10 years starting at age 2.  Prostate cancer screening. Recommendations will vary depending on your family history and other risks.  Hepatitis C blood test.  Hepatitis B blood test.  Sexually transmitted disease (STD) testing.  Diabetes screening. This is done by checking your blood sugar (glucose) after you have not eaten for a while (fasting). You may have this done every 1-3 years.  Abdominal aortic aneurysm (AAA) screening. You may need this if you are a current or former smoker.  Osteoporosis. You may be screened starting at age 71 if you are at high risk. Talk with your health care provider about  your test results, treatment options, and if necessary, the need for more tests. Vaccines  Your health care provider may recommend certain vaccines, such as:  Influenza vaccine. This is recommended every year.  Tetanus, diphtheria, and acellular pertussis (Tdap, Td) vaccine. You may need a Td booster every 10  years.  Zoster vaccine. You may need this after age 46.  Pneumococcal 13-valent conjugate (PCV13) vaccine. One dose is recommended after age 79.  Pneumococcal polysaccharide (PPSV23) vaccine. One dose is recommended after age 52. Talk to your health care provider about which screenings and vaccines you need and how often you need them. This information is not intended to replace advice given to you by your health care provider. Make sure you discuss any questions you have with your health care provider. Document Released: 05/01/2015 Document Revised: 12/23/2015 Document Reviewed: 02/03/2015 Elsevier Interactive Patient Education  2017 [place] Prevention in the Home Falls can cause injuries. They can happen to people of all ages. There are many things you can do to make your home safe and to help prevent falls. What can I do on the outside of my home?  Regularly fix the edges of walkways and driveways and fix any cracks.  Remove anything that might make you trip as you walk through a door, such as a raised step or threshold.  Trim any bushes or trees on the path to your home.  Use bright outdoor lighting.  Clear any walking paths of anything that might make someone trip, such as rocks or tools.  Regularly check to see if handrails are loose or broken. Make sure that both sides of any steps have handrails.  Any raised decks and porches should have guardrails on the edges.  Have any leaves, snow, or ice cleared regularly.  Use sand or salt on walking paths during winter.  Clean up any spills in your garage right away. This includes oil or grease spills. What can I do in the bathroom?  Use night lights.  Install grab bars by the toilet and in the tub and shower. Do not use towel bars as grab bars.  Use non-skid mats or decals in the tub or shower.  If you need to sit down in the shower, use a plastic, non-slip stool.  Keep the floor dry. Clean up any water that  spills on the floor as soon as it happens.  Remove soap buildup in the tub or shower regularly.  Attach bath mats securely with double-sided non-slip rug tape.  Do not have throw rugs and other things on the floor that can make you trip. What can I do in the bedroom?  Use night lights.  Make sure that you have a light by your bed that is easy to reach.  Do not use any sheets or blankets that are too big for your bed. They should not hang down onto the floor.  Have a firm chair that has side arms. You can use this for support while you get dressed.  Do not have throw rugs and other things on the floor that can make you trip. What can I do in the kitchen?  Clean up any spills right away.  Avoid walking on wet floors.  Keep items that you use a lot in easy-to-reach places.  If you need to reach something above you, use a strong step stool that has a grab bar.  Keep electrical cords out of the way.  Do not use floor polish or wax  that makes floors slippery. If you must use wax, use non-skid floor wax.  Do not have throw rugs and other things on the floor that can make you trip. What can I do with my stairs?  Do not leave any items on the stairs.  Make sure that there are handrails on both sides of the stairs and use them. Fix handrails that are broken or loose. Make sure that handrails are as long as the stairways.  Check any carpeting to make sure that it is firmly attached to the stairs. Fix any carpet that is loose or worn.  Avoid having throw rugs at the top or bottom of the stairs. If you do have throw rugs, attach them to the floor with carpet tape.  Make sure that you have a light switch at the top of the stairs and the bottom of the stairs. If you do not have them, ask someone to add them for you. What else can I do to help prevent falls?  Wear shoes that:  Do not have high heels.  Have rubber bottoms.  Are comfortable and fit you well.  Are closed at the  toe. Do not wear sandals.  If you use a stepladder:  Make sure that it is fully opened. Do not climb a closed stepladder.  Make sure that both sides of the stepladder are locked into place.  Ask someone to hold it for you, if possible.  Clearly mark and make sure that you can see:  Any grab bars or handrails.  First and last steps.  Where the edge of each step is.  Use tools that help you move around (mobility aids) if they are needed. These include:  Canes.  Walkers.  Scooters.  Crutches.  Turn on the lights when you go into a dark area. Replace any light bulbs as soon as they burn out.  Set up your furniture so you have a clear path. Avoid moving your furniture around.  If any of your floors are uneven, fix them.  If there are any pets around you, be aware of where they are.  Review your medicines with your doctor. Some medicines can make you feel dizzy. This can increase your chance of falling. Ask your doctor what other things that you can do to help prevent falls. This information is not intended to replace advice given to you by your health care provider. Make sure you discuss any questions you have with your health care provider. Document Released: 01/29/2009 Document Revised: 09/10/2015 Document Reviewed: 05/09/2014 Elsevier Interactive Patient Education  2017 [employer].

## 2020-03-18 NOTE — Progress Notes (Signed)
Subjective:   Pope Brunty is a 74 y.o. male who presents for an Initial Medicare Annual Wellness Visit.  I connected with Aris Georgia today by telephone and verified that I am speaking with the correct person using two identifiers. Location patient: home Location provider: work Persons participating in the virtual visit: patient, provider.   I discussed the limitations, risks, security and privacy concerns of performing an evaluation and management service by telephone and the availability of in person appointments. I also discussed with the patient that there may be a patient responsible charge related to this service. The patient expressed understanding and verbally consented to this telephonic visit.    Interactive audio and video telecommunications were attempted between this provider and patient, however failed, due to patient having technical difficulties OR patient did not have access to video capability.  We continued and completed visit with audio only.      Review of Systems    N/A  Cardiac Risk Factors include: advanced age (>75men, >33 women);male gender;hypertension;dyslipidemia     Objective:    Today's Vitals   There is no height or weight on file to calculate BMI.  Advanced Directives 03/18/2020 03/25/2016 09/23/2014  Does Patient Have a Medical Advance Directive? Yes Yes No  Type of Paramedic of [place];Living will - -  Does patient want to make changes to medical advance directive? No - Patient declined - -  Would patient like information on creating a medical advance directive? - - No - patient declined information    Current Medications (verified) Outpatient Encounter Medications as of 03/18/2020  Medication Sig  . aspirin 81 MG tablet Take 81 mg by mouth daily. Pt takes 1- 81 mg daily  . losartan (COZAAR) 50 MG tablet TAKE 1 TABLET BY MOUTH EVERY DAY  . simvastatin (ZOCOR) 20 MG tablet TAKE 1 TABLET BY MOUTH EVERY DAY IN THE  EVENING  . fluticasone (FLONASE) 50 MCG/ACT nasal spray SPRAY 2 SPRAYS INTO EACH NOSTRIL EVERY DAY (Patient not taking: Reported on 03/18/2020)  . Ibuprofen-diphenhydrAMINE Cit (ADVIL PM PO) Take by mouth. (Patient not taking: Reported on 03/18/2020)  . [DISCONTINUED] benzonatate (TESSALON PERLES) 100 MG capsule Take one to two capsules every 8 hours as needed for cough. (Patient not taking: Reported on 03/18/2020)  . [DISCONTINUED] HYDROcodone-homatropine (HYDROMET) 5-1.5 MG/5ML syrup Take 5 mLs by mouth every 6 (six) hours as needed. (Patient not taking: Reported on 03/18/2020)   Facility-Administered Encounter Medications as of 03/18/2020  Medication  . 0.9 %  sodium chloride infusion    Allergies (verified) Iodine and Lisinopril   History: Past Medical History:  Diagnosis Date  . Allergy   . BACK PAIN, CHRONIC 03/12/2007  . Cataract   . Compression fracture of thoracic vertebra (HCC)    traumatic    T8  . CORONARY ARTERY DISEASE 08/18/2008  . HYPERLIPIDEMIA 08/18/2008  . HYPERTENSION 03/12/2007  . IGT (impaired glucose tolerance)   . OSTEOARTHRITIS 03/12/2007   Past Surgical History:  Procedure Laterality Date  . CARDIAC CATHETERIZATION  2010  . CATARACT EXTRACTION, BILATERAL    . COLONOSCOPY    . DENTAL SURGERY    . HERNIA REPAIR     ingunial  . POLYPECTOMY     Family History  Problem Relation Age of Onset  . Diabetes Mother   . Cancer Mother        renal  . Renal cancer Mother   . Heart disease Father   . Stroke Father   .  Diabetes Sister   . Colon cancer Neg Hx   . Colon polyps Neg Hx    Social History   Socioeconomic History  . Marital status: Married    Spouse name: Not on file  . Number of children: Not on file  . Years of education: Not on file  . Highest education level: Not on file  Occupational History  . Not on file  Tobacco Use  . Smoking status: Former Smoker    Quit date: 04/18/1970    Years since quitting: 49.9  . Smokeless tobacco: Never Used    . Tobacco comment: smoked 3 yrs- [phone]  Vaping Use  . Vaping Use: Never used  Substance and Sexual Activity  . Alcohol use: Yes    Alcohol/week: 14.0 standard drinks    Types: 7 Glasses of wine, 7 Standard drinks or equivalent per week  . Drug use: No  . Sexual activity: Not on file  Other Topics Concern  . Not on file  Social History Narrative  . Not on file   Social Determinants of Health   Financial Resource Strain: Low Risk   . Difficulty of Paying Living Expenses: Not hard at all  Food Insecurity: No Food Insecurity  . Worried About Charity fundraiser in the Last Year: Never true  . Ran Out of Food in the Last Year: Never true  Transportation Needs: No Transportation Needs  . Lack of Transportation (Medical): No  . Lack of Transportation (Non-Medical): No  Physical Activity: Inactive  . Days of Exercise per Week: 0 days  . Minutes of Exercise per Session: 0 min  Stress: No Stress Concern Present  . Feeling of Stress : Not at all  Social Connections: Moderately Isolated  . Frequency of Communication with Friends and Family: Once a week  . Frequency of Social Gatherings with Friends and Family: More than three times a week  . Attends Religious Services: Never  . Active Member of Clubs or Organizations: No  . Attends Archivist Meetings: Never  . Marital Status: Married    Tobacco Counseling Counseling given: Not Answered Comment: smoked 3 yrs- 1968-1971   Clinical Intake:  Pre-visit preparation completed: Yes  Pain : No/denies pain     Nutritional Risks: None Diabetes: No  How often do you need to have someone help you when you read instructions, pamphlets, or other written materials from your doctor or pharmacy?: 1 - Never What is the last grade level you completed in school?: College  Diabetic? No   Interpreter Needed?: No  Information entered by :: [place] of Daily Living In your present state of health, do you  have any difficulty performing the following activities: 03/18/2020  Hearing? Y  Comment Has hearing issues will be going to see a audiologist at the start of 2022  Vision? N  Difficulty concentrating or making decisions? N  Walking or climbing stairs? N  Dressing or bathing? N  Doing errands, shopping? N  Preparing Food and eating ? N  Using the Toilet? N  In the past six months, have you accidently leaked urine? N  Do you have problems with loss of bowel control? N  Managing your Medications? N  Managing your Finances? N  Housekeeping or managing your Housekeeping? N  Some recent data might be hidden    Patient Care Team: Caren Macadam, MD as PCP - General (Family Medicine) Katy Apo, MD as Consulting Physician (Ophthalmology)  Indicate any recent Medical  Services you may have received from other than [facility] providers in the past year (date may be approximate).     Assessment:   This is a routine wellness examination for [facility].  Hearing/Vision screen  Hearing Screening   125Hz  250Hz  500Hz  1000Hz  2000Hz  3000Hz  4000Hz  6000Hz  8000Hz   Right ear:           Left ear:           Vision Screening Comments: Patient states gets eyes examined yearly. Recently had cataract surgery   Dietary issues and exercise activities discussed: Current Exercise Habits: The patient has a physically strenuous job, but has no regular exercise apart from work.  Goals    . Exercise 150 min/wk Moderate Activity    . Weight (lb) < 200 lb (90.7 kg)     Try eating balanced meal to avoid grazing  Carrots and peanut butter or apples Drink water in the am  Controlling portions or sharing meals  Other food tips Fat free or low fat dairy products Fish high in omega-3 acids ( salmon, tuna, trout) Fruits, such as apples, bananas, oranges, pears, prunes Legumes, such as kidney beans, lentils, checkpeas, black-eyed peas and lima beans Vegetables; broccoli, cabbage, carrots Whole grains;   Plant  fats are better; decrease "white" foods as pasta, rice, bread and desserts, sugar; Avoid red meat (limiting) palm and coconut oils; sugary foods and beverages  Two nutrients that raise blood chol levels are saturated fats and trans fat; in hydrogenated oils and fats, as stick margarine, baked goods (cookes, cakes, pies, crackers; frosting; and coffee creamers;   Some Fats lower cholesterol: Monounsaturated and polyunsaturated  Avocados Corn, sunflower, and soybean oils Nuts and seeds, such as walnuts Olive, canola, peanut, safflower, and sesame oils Peanut butter Salmon and trout Tofu         Depression Screen PHQ 2/9 Scores 03/18/2020 03/13/2019 03/25/2016 04/07/2015 12/23/2014 10/29/2013  PHQ - 2 Score 0 0 0 0 0 0  PHQ- 9 Score 0 - - - - -    Fall Risk Fall Risk  03/18/2020 03/13/2019 03/25/2016 04/07/2015 12/23/2014  Falls in the past year? 0 0 No No No  Number falls in past yr: 0 0 - - -  Injury with Fall? 0 0 - - -  Risk for fall due to : No Fall Risks - - - -  Follow up Falls evaluation completed;Falls prevention discussed - - - -    Any stairs in or around the home? Yes  If so, are there any without handrails? No  Home free of loose throw rugs in walkways, pet beds, electrical cords, etc? Yes  Adequate lighting in your home to reduce risk of falls? Yes   ASSISTIVE DEVICES UTILIZED TO PREVENT FALLS:  Life alert? No  Use of a cane, walker or w/c? No  Grab bars in the bathroom? No  Shower chair or bench in shower? No  Elevated toilet seat or a handicapped toilet? Yes    Cognitive Function:        Immunizations Immunization History  Administered Date(s) Administered  . Fluad Quad(high Dose 65+) 12/29/2018  . Influenza Split 02/14/2011  . Influenza Whole 03/12/2007, 03/23/2009  . Influenza, High Dose Seasonal PF 12/23/2014, 03/25/2016, 01/29/2018, 01/01/2020  . Influenza-Unspecified 12/31/2018  . PFIZER SARS-COV-2 Vaccination 05/07/2019, 05/28/2019, 01/01/2020  .  Pneumococcal Conjugate-13 10/29/2013, 12/23/2014  . Pneumococcal Polysaccharide-23 08/16/2011  . Tdap 08/13/2010, 09/08/2011  . Zoster 03/22/2011    TDAP status: Up to date Flu Vaccine  status: Up to date Pneumococcal vaccine status: Up to date Covid-19 vaccine status: Completed vaccines  Qualifies for Shingles Vaccine? Yes   Zostavax completed Yes   Shingrix Completed?: No.    Education has been provided regarding the importance of this vaccine. Patient has been advised to call insurance company to determine out of pocket expense if they have not yet received this vaccine. Advised may also receive vaccine at local pharmacy or Health Dept. Verbalized acceptance and understanding.  Screening Tests Health Maintenance  Topic Date Due  . Hepatitis C Screening  Never done  . COLONOSCOPY  12/12/2020  . TETANUS/TDAP  09/07/2021  . INFLUENZA VACCINE  Completed  . COVID-19 Vaccine  Completed  . PNA vac Low Risk Adult  Completed    Health Maintenance  Health Maintenance Due  Topic Date Due  . Hepatitis C Screening  Never done    Colorectal cancer screening: Completed 12/12/2017. Repeat every 3 years  Lung Cancer Screening: (Low Dose CT Chest recommended if Age 45-80 years, 30 pack-year currently smoking OR have quit w/in 15years.) does not qualify.   Lung Cancer Screening Referral: N/A   Additional Screening:  Hepatitis C Screening: does qualify;   Vision Screening: Recommended annual ophthalmology exams for early detection of glaucoma and other disorders of the eye. Is the patient up to date with their annual eye exam?  Yes  Who is the provider or what is the name of the office in which the patient attends annual eye exams? Dr. Prudencio Burly If pt is not established with a provider, would they like to be referred to a provider to establish care? No .   Dental Screening: Recommended annual dental exams for proper oral hygiene  Community Resource Referral / Chronic Care Management: CRR  required this visit?  No   CCM required this visit?  No      Plan:     I have personally reviewed and noted the following in the patient's chart:   . Medical and social history . Use of alcohol, tobacco or illicit drugs  . Current medications and supplements . Functional ability and status . Nutritional status . Physical activity . Advanced directives . List of other physicians . Hospitalizations, surgeries, and ER visits in previous 12 months . Vitals . Screenings to include cognitive, depression, and falls . Referrals and appointments  In addition, I have reviewed and discussed with patient certain preventive protocols, quality metrics, and best practice recommendations. A written personalized care plan for preventive services as well as general preventive health recommendations were provided to patient.     Ofilia Neas, LPN   [id number]   Nurse Notes: None

## 2020-03-24 ENCOUNTER — Other Ambulatory Visit: Payer: Self-pay | Admitting: Family Medicine

## 2020-04-08 ENCOUNTER — Other Ambulatory Visit: Payer: Self-pay

## 2020-04-08 ENCOUNTER — Encounter: Payer: Self-pay | Admitting: Family Medicine

## 2020-04-08 ENCOUNTER — Ambulatory Visit (INDEPENDENT_AMBULATORY_CARE_PROVIDER_SITE_OTHER): Payer: Medicare HMO | Admitting: Family Medicine

## 2020-04-08 VITALS — BP 126/80 | HR 78 | Temp 98.2°F | Ht 71.5 in | Wt 237.3 lb

## 2020-04-08 DIAGNOSIS — I10 Essential (primary) hypertension: Secondary | ICD-10-CM | POA: Diagnosis not present

## 2020-04-08 DIAGNOSIS — R3915 Urgency of urination: Secondary | ICD-10-CM | POA: Diagnosis not present

## 2020-04-08 DIAGNOSIS — G8929 Other chronic pain: Secondary | ICD-10-CM | POA: Diagnosis not present

## 2020-04-08 DIAGNOSIS — Z1159 Encounter for screening for other viral diseases: Secondary | ICD-10-CM | POA: Diagnosis not present

## 2020-04-08 DIAGNOSIS — R7302 Impaired glucose tolerance (oral): Secondary | ICD-10-CM | POA: Diagnosis not present

## 2020-04-08 DIAGNOSIS — M5442 Lumbago with sciatica, left side: Secondary | ICD-10-CM | POA: Diagnosis not present

## 2020-04-08 DIAGNOSIS — E785 Hyperlipidemia, unspecified: Secondary | ICD-10-CM | POA: Diagnosis not present

## 2020-04-08 DIAGNOSIS — N400 Enlarged prostate without lower urinary tract symptoms: Secondary | ICD-10-CM | POA: Diagnosis not present

## 2020-04-08 DIAGNOSIS — I2510 Atherosclerotic heart disease of native coronary artery without angina pectoris: Secondary | ICD-10-CM

## 2020-04-08 DIAGNOSIS — R972 Elevated prostate specific antigen [PSA]: Secondary | ICD-10-CM | POA: Diagnosis not present

## 2020-04-08 MED ORDER — SHINGRIX 50 MCG/0.5ML IM SUSR: 0.5000 mL | Freq: Once | INTRAMUSCULAR | 0 refills | Status: AC

## 2020-04-08 NOTE — Progress Notes (Signed)
[name] DOB: 1946-02-22 Encounter date: 04/08/2020  This is a 74 y.o. male who presents for complete physical   History of present illness/Additional concerns: Last visit with me was 03/13/2019:   *feels too stiff; promised wife he would get PT at first of year. This is in low back. If he walks, from left hip down to knee hurts sharp; and this has been a change. If he drops something on floor; has to go to knee to pick it up.   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 Starts to bother him after about 30 yards. Supportive; usually tennis shoes.   Nose runs intermittently, clear. This morning wasn't running and he had headache. If he eats foods that are tomato based, nose will start running.   Has had both pfizer shots, booster, pneumonia shots.   Energy level is pretty good generally.   Drinks iced tea regularly or ice water. Sips through day. Has to urinate every time that he stops car. Not producing to meet level of urgency. Wakes at least twice at night to urinate. Better if cuts off evening fluid.   HTN: on losartan 50 mg daily. Doesn't check at home.   HL: on simvastatin 20 mg daily; doing ok with this.    CAD:bp is controlled; takes ASA 42m daily.  Colon polyps: recent colonoscopy 12/12/17 with rec to repeat in 3 years due to adenomatous polyps.  Just had cataract surgery in spring; did well. Also has glasses now and vision has improved a lot.   Sees derm yearly; Dr. JRonnald Ramp   Past Medical History:  Diagnosis Date  . Allergy   . BACK PAIN, CHRONIC 03/12/2007  . Cataract   . Compression fracture of thoracic vertebra (HCC)    traumatic    T8  . CORONARY ARTERY DISEASE 08/18/2008  . HYPERLIPIDEMIA 08/18/2008  . HYPERTENSION 03/12/2007  . IGT (impaired glucose tolerance)   . OSTEOARTHRITIS 03/12/2007   Past Surgical History:  Procedure Laterality Date  . CARDIAC CATHETERIZATION  2010  . CATARACT EXTRACTION, BILATERAL    . COLONOSCOPY     . DENTAL SURGERY    . HERNIA REPAIR     ingunial  . POLYPECTOMY     Allergies  Allergen Reactions  . Iodine     REACTION: unspecified  . Lisinopril Cough   Current Meds  Medication Sig  . aspirin 81 MG tablet Take 81 mg by mouth daily. Pt takes 1- 81 mg daily  . fluticasone (FLONASE) 50 MCG/ACT nasal spray SPRAY 2 SPRAYS INTO EACH NOSTRIL EVERY DAY  . Ibuprofen-diphenhydrAMINE Cit (ADVIL PM PO) Take by mouth.  . losartan (COZAAR) 50 MG tablet TAKE 1 TABLET BY MOUTH EVERY DAY  . simvastatin (ZOCOR) 20 MG tablet TAKE 1 TABLET BY MOUTH EVERY DAY IN THE EVENING   Current Facility-Administered Medications for the 04/08/20 encounter (Office Visit) with KCaren Macadam MD  Medication  . 0.9 %  sodium chloride infusion   Social History   Tobacco Use  . Smoking status: Former Smoker    Quit date: 04/18/1970    Years since quitting: 50.0  . Smokeless tobacco: Never Used  . Tobacco comment: smoked 3 yrs- 1968-1971  Substance Use Topics  . Alcohol use: Yes    Alcohol/week: 14.0 standard drinks    Types: 7 Glasses of wine, 7 Standard drinks or equivalent per week   Family History  Problem Relation Age of Onset  . Diabetes Mother   .  Renal cancer Mother   . Macular degeneration Mother   . Heart disease Father   . Stroke Father   . Obesity Sister        she had lap band  . Colon cancer Neg Hx   . Colon polyps Neg Hx      Review of Systems  Constitutional: Negative for activity change, appetite change, chills, fatigue, fever and unexpected weight change.  HENT: Negative for congestion, ear pain, hearing loss, sinus pressure, sinus pain, sore throat and trouble swallowing.   Eyes: Negative for pain and visual disturbance.  Respiratory: Negative for cough, chest tightness, shortness of breath and wheezing.   Cardiovascular: Negative for chest pain, palpitations and leg swelling.  Gastrointestinal: Negative for abdominal distention, abdominal pain, blood in stool,  constipation, diarrhea, nausea and vomiting.  Genitourinary: Positive for urgency. Negative for decreased urine volume, difficulty urinating, dysuria, penile pain and testicular pain.  Musculoskeletal: Negative for arthralgias, back pain and joint swelling.  Skin: Negative for rash.  Neurological: Negative for dizziness, weakness, numbness and headaches.  Hematological: Negative for adenopathy. Does not bruise/bleed easily.  Psychiatric/Behavioral: Negative for agitation, sleep disturbance and suicidal ideas. The patient is not nervous/anxious.     CBC:  Lab Results  Component Value Date   WBC 7.3 04/08/2020   HGB 16.3 04/08/2020   HCT 48.2 04/08/2020   MCH 29.7 04/08/2020   MCHC 33.8 04/08/2020   RDW 12.9 04/08/2020   PLT 215 04/08/2020   MPV 11.7 04/08/2020   CMP: Lab Results  Component Value Date   NA 140 04/08/2020   K 4.8 04/08/2020   CL 103 04/08/2020   CO2 26 04/08/2020   GLUCOSE 109 (H) 04/08/2020   BUN 11 04/08/2020   CREATININE 0.90 04/08/2020   GFRAA  08/01/2008    >60        The eGFR has been calculated using the MDRD equation. This calculation has not been validated in all clinical situations. eGFR's persistently <60 mL/min signify possible Chronic Kidney Disease.   CALCIUM 9.8 04/08/2020   PROT 7.3 04/08/2020   BILITOT 1.4 (H) 04/08/2020   ALKPHOS 88 03/13/2019   ALT 26 04/08/2020   AST 21 04/08/2020   LIPID: Lab Results  Component Value Date   CHOL 148 04/08/2020   TRIG 120 04/08/2020   HDL 38 (L) 04/08/2020   LDLCALC 88 04/08/2020    Objective:  BP 126/80 (BP Location: Left Arm, Patient Position: Sitting, Cuff Size: Large)   Pulse 78   Temp 98.2 F (36.8 C) (Oral)   Ht 5' 11.5" (1.816 m)   Wt 237 lb 4.8 oz (107.6 kg)   BMI 32.64 kg/m   Weight: 237 lb 4.8 oz (107.6 kg)   BP Readings from Last 3 Encounters:  04/08/20 126/80  03/13/19 128/78  07/02/18 130/64   Wt Readings from Last 3 Encounters:  04/08/20 237 lb 4.8 oz (107.6 kg)   03/13/19 240 lb 6.4 oz (109 kg)  07/02/18 241 lb 9.6 oz (109.6 kg)    Physical Exam Constitutional:      General: He is not in acute distress.    Appearance: He is well-developed and well-nourished.  HENT:     Head: Normocephalic and atraumatic.     Right Ear: External ear normal.     Left Ear: External ear normal.     Nose: Nose normal.     Mouth/Throat:     Mouth: Oropharynx is clear and moist.     Pharynx: No oropharyngeal  exudate.  Eyes:     Conjunctiva/sclera: Conjunctivae normal.     Pupils: Pupils are equal, round, and reactive to light.  Neck:     Thyroid: No thyromegaly.  Cardiovascular:     Rate and Rhythm: Normal rate and regular rhythm.     Pulses: Intact distal pulses.     Heart sounds: Normal heart sounds. No murmur heard. No friction rub. No gallop.   Pulmonary:     Effort: Pulmonary effort is normal. No respiratory distress.     Breath sounds: Normal breath sounds. No stridor. No wheezing or rales.  Abdominal:     General: Bowel sounds are normal.     Palpations: Abdomen is soft.  Musculoskeletal:        General: Normal range of motion.     Cervical back: Neck supple.     Comments: He has pain in lower back and lateral hips with full hip flexion.  Extension of last hip is limited to about 45 degrees secondary to discomfort in the gluteus/lower back/hip.  Mild discomfort with internal rotations bilateral hips as well as slight decreased range of motion.  Feet:     Comments: Normal bilateral foot exams.  Normal sensation. Skin:    General: Skin is warm and dry.  Neurological:     Mental Status: He is alert and oriented to person, place, and time.     Deep Tendon Reflexes:     Reflex Scores:      Bicep reflexes are 2+ on the right side and 2+ on the left side.      Brachioradialis reflexes are 2+ on the right side and 2+ on the left side.      Patellar reflexes are 2+ on the right side and 2+ on the left side.      Achilles reflexes are 2+ on the right  side and 2+ on the left side. Psychiatric:        Mood and Affect: Mood and affect normal.        Behavior: Behavior normal.        Thought Content: Thought content normal.        Judgment: Judgment normal.     Assessment/Plan: There are no preventive care reminders to display for this patient. Health Maintenance reviewed - shingrix rx sent for him today.  1. Chronic low back pain with left-sided sciatica, unspecified back pain laterality If no improvement within a few months from physical therapy or if any worsening I would consider repeating MRI of the lumbar spine since it has been quite a few years since last imaging (2016).  He likely would benefit from seeing specialist and may benefit from injections given prior MRI findings. - Ambulatory referral to Physical Therapy; Future  2. Essential hypertension Blood pressures well controlled.  Continue losartan 50 mg daily. - CBC with Differential/Platelet; Future - Comprehensive metabolic panel; Future - Comprehensive metabolic panel - CBC with Differential/Platelet  3. Impaired glucose tolerance - Hemoglobin A1c; Future - Hemoglobin A1c  4. Dyslipidemia Cholesterols been well controlled.  Continue simvastatin 20 mg daily. - Lipid panel; Future - Lipid panel  5. Atherosclerosis of native coronary artery of native heart without angina pectoris Continue with aspirin 81 mg daily.  Blood pressure and cholesterol are stable.  6. Encounter for hepatitis C screening test for low risk patient - Hepatitis C antibody; Future - Hepatitis C antibody  7. Urinary urgency - Urinalysis with Reflex Microscopic; Future - Urinalysis with Reflex Microscopic  8. Prostate  enlargement - PSA; Future - PSA   Return in about 6 months (around 10/07/2020) for Chronic condition visit. Visit time including chart review, exam, charting time, review of back pathology/treatment options 42 minutes  Micheline Rough, MD

## 2020-04-09 LAB — URINALYSIS, ROUTINE W REFLEX MICROSCOPIC
Bilirubin Urine: NEGATIVE
Glucose, UA: NEGATIVE
Hgb urine dipstick: NEGATIVE
Hyaline Cast: NONE SEEN /LPF
Ketones, ur: NEGATIVE
Nitrite: NEGATIVE
Protein, ur: NEGATIVE
RBC / HPF: NONE SEEN /HPF (ref 0–2)
Specific Gravity, Urine: 1.018 (ref 1.001–1.03)
Squamous Epithelial / HPF: NONE SEEN /HPF (ref <=5)
pH: 5.5 (ref 5.0–8.0)

## 2020-04-09 LAB — HEMOGLOBIN A1C
Hgb A1c MFr Bld: 6.2 % of total Hgb — ABNORMAL HIGH (ref <5.7)
Mean Plasma Glucose: 131 mg/dL
eAG (mmol/L): 7.3 mmol/L

## 2020-04-09 LAB — LIPID PANEL
Cholesterol: 148 mg/dL (ref <200)
HDL: 38 mg/dL — ABNORMAL LOW (ref >=40)
LDL Cholesterol (Calc): 88 mg/dL (calc)
Non-HDL Cholesterol (Calc): 110 mg/dL (calc) (ref <130)
Total CHOL/HDL Ratio: 3.9 (calc) (ref <5.0)
Triglycerides: 120 mg/dL (ref <150)

## 2020-04-09 LAB — CBC WITH DIFFERENTIAL/PLATELET
Absolute Monocytes: 533 cells/uL (ref 200–950)
Basophils Absolute: 73 cells/uL (ref 0–200)
Basophils Relative: 1 %
Eosinophils Absolute: 110 cells/uL (ref 15–500)
Eosinophils Relative: 1.5 %
HCT: 48.2 % (ref 38.5–50.0)
Hemoglobin: 16.3 g/dL (ref 13.2–17.1)
Lymphs Abs: 2584 cells/uL (ref 850–3900)
MCH: 29.7 pg (ref 27.0–33.0)
MCHC: 33.8 g/dL (ref 32.0–36.0)
MCV: 87.8 fL (ref 80.0–100.0)
MPV: 11.7 fL (ref 7.5–12.5)
Monocytes Relative: 7.3 %
Neutro Abs: 4000 cells/uL (ref 1500–7800)
Neutrophils Relative %: 54.8 %
Platelets: 215 10*3/uL (ref 140–400)
RBC: 5.49 10*6/uL (ref 4.20–5.80)
RDW: 12.9 % (ref 11.0–15.0)
Total Lymphocyte: 35.4 %
WBC: 7.3 10*3/uL (ref 3.8–10.8)

## 2020-04-09 LAB — COMPREHENSIVE METABOLIC PANEL
AG Ratio: 1.9 (calc) (ref 1.0–2.5)
ALT: 26 U/L (ref 9–46)
AST: 21 U/L (ref 10–35)
Albumin: 4.8 g/dL (ref 3.6–5.1)
Alkaline phosphatase (APISO): 92 U/L (ref 35–144)
BUN: 11 mg/dL (ref 7–25)
CO2: 26 mmol/L (ref 20–32)
Calcium: 9.8 mg/dL (ref 8.6–10.3)
Chloride: 103 mmol/L (ref 98–110)
Creat: 0.9 mg/dL (ref 0.70–1.18)
Globulin: 2.5 g/dL (calc) (ref 1.9–3.7)
Glucose, Bld: 109 mg/dL — ABNORMAL HIGH (ref 65–99)
Potassium: 4.8 mmol/L (ref 3.5–5.3)
Sodium: 140 mmol/L (ref 135–146)
Total Bilirubin: 1.4 mg/dL — ABNORMAL HIGH (ref 0.2–1.2)
Total Protein: 7.3 g/dL (ref 6.1–8.1)

## 2020-04-09 LAB — PSA: PSA: 5.76 ng/mL — ABNORMAL HIGH (ref <=4.0)

## 2020-04-09 LAB — HEPATITIS C ANTIBODY
Hepatitis C Ab: NONREACTIVE
SIGNAL TO CUT-OFF: 0.01 (ref <1.00)

## 2020-04-10 ENCOUNTER — Encounter: Payer: Self-pay | Admitting: Family Medicine

## 2020-04-13 NOTE — Telephone Encounter (Signed)
See results note. 

## 2020-04-13 NOTE — Addendum Note (Signed)
Addended by: Johnella Moloney on: 04/13/2020 12:20 PM   Modules accepted: Orders

## 2020-05-26 ENCOUNTER — Ambulatory Visit: Payer: Medicare HMO | Attending: Family Medicine | Admitting: Physical Therapy

## 2020-05-26 ENCOUNTER — Encounter: Payer: Self-pay | Admitting: Physical Therapy

## 2020-05-26 ENCOUNTER — Other Ambulatory Visit: Payer: Self-pay

## 2020-05-26 DIAGNOSIS — R262 Difficulty in walking, not elsewhere classified: Secondary | ICD-10-CM | POA: Insufficient documentation

## 2020-05-26 DIAGNOSIS — M5442 Lumbago with sciatica, left side: Secondary | ICD-10-CM | POA: Insufficient documentation

## 2020-05-26 DIAGNOSIS — M6281 Muscle weakness (generalized): Secondary | ICD-10-CM

## 2020-05-26 DIAGNOSIS — G8929 Other chronic pain: Secondary | ICD-10-CM

## 2020-05-26 NOTE — Patient Instructions (Signed)
Access Code: [facility] URL: [email or web address] Date: 05/26/2020 Prepared by: Ruben Im  Exercises Hooklying Single Knee to Chest Stretch - 1 x daily - 7 x weekly - 1 sets - 3 reps - 30 hold Supine Lower Trunk Rotation - 1 x daily - 7 x weekly - 1 sets - 3 reps - 30 hold Hooklying Hamstring Stretch with Strap - 1 x daily - 7 x weekly - 1 sets - 30 hold Hip Flexor Stretch on Step - 1 x daily - 7 x weekly - 2 sets - 5 reps

## 2020-05-26 NOTE — Therapy (Signed)
[facility] [address], [place], [place], [address] Phone: [phone]   Fax:  [phone]  Physical Therapy Evaluation  Patient Details  Name: [name] MRN: [id number] Date of Birth: Nov 26, 1945 Referring Provider (PT): Dr. Ethlyn Gallery   Encounter Date: 05/26/2020   PT End of Session - 05/26/20 1704    Visit Number 1    Date for PT Re-Evaluation 08/18/20    Authorization Type [employer] Medicare    PT Start Time 4562    PT Stop Time 0843    PT Time Calculation (min) 48 min    Activity Tolerance Patient tolerated treatment well           Past Medical History:  Diagnosis Date  . Allergy   . BACK PAIN, CHRONIC 03/12/2007  . Cataract   . Compression fracture of thoracic vertebra (HCC)    traumatic    T8  . CORONARY ARTERY DISEASE 08/18/2008  . HYPERLIPIDEMIA 08/18/2008  . HYPERTENSION 03/12/2007  . IGT (impaired glucose tolerance)   . OSTEOARTHRITIS 03/12/2007    Past Surgical History:  Procedure Laterality Date  . CARDIAC CATHETERIZATION  2010  . CATARACT EXTRACTION, BILATERAL    . COLONOSCOPY    . DENTAL SURGERY    . HERNIA REPAIR     ingunial  . POLYPECTOMY      There were no vitals filed for this visit.    Subjective Assessment - 05/26/20 0754    Subjective Belt line to hips and left sharp shooting pain to knee;  Not constant.  Worse with bending forward and turning over in the bed.   Had this for years.  Had a ESI at [employer] 2x got immediate relief for 4-5 months in the 1990s.    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 maybe 1 block    Diagnostic tests 2 MRIs "didn't  show anything"    Patient Stated Goals stop hurting; improve quality of life; the hands hurt for golf but I would probably play more    Currently in Pain? Yes    Pain Score 7     Pain Location Back    Pain Orientation Right;Left;Lower    Pain Descriptors / Indicators Sharp;Shooting    Pain Type Chronic pain    Pain Radiating Towards left knee    Pain Onset More than a month ago    Pain Frequency Intermittent    Aggravating Factors  bending over; turning in bed    Pain Relieving Factors Meloxicam takes the edge off; lying flat on back              [facility] PT Assessment - 05/26/20 0001      Assessment   Medical Diagnosis chronic LBP with left sciatica    Referring Provider (PT) Dr. Ethlyn Gallery    Onset Date/Surgical Date --   chronic   Next MD Visit as needed    Prior Therapy not for back      Precautions   Precautions None      Restrictions   Weight Bearing Restrictions No      Balance Screen  Has the patient fallen in the past 6 months No    Has the patient had a decrease in activity level because of a fear of falling?  No    Is the patient reluctant to leave their home because of a fear of falling?  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      Observation/Other Assessments   Focus on Therapeutic Outcomes (FOTO)  47% (goal 56%)      Sit to Stand   Comments turns to the side of the chair and needs UE assist with sit to stand      Posture/Postural Control   Posture/Postural Control No significant limitations    Posture Comments decreased lumbar lordosis      AROM   Overall AROM Comments increased pain with lying prone    Lumbar Flexion fingertips to knees (+gowers sign)    Lumbar Extension 10     Lumbar - Right Side Bend 20    Lumbar - Left Side Bend 25      Strength   Overall Strength Comments no inc pain but difficulty with bridging    Right Hip Flexion 4+/5    Right Hip ABduction 4+/5    Left Hip Flexion 4+/5    Left Hip ABduction 4/5    Lumbar Flexion 4-/5    Lumbar Extension 4/5      Flexibility   Hamstrings right 45 degrees; left 40 degrees    Quadriceps decreased hip flexor length 5 degrees bil      Palpation   Palpation comment No tender points      Slump test   Findings Negative      Prone Knee Bend Test   Findings Negative      Straight Leg Raise   Findings Negative      Hip Scouring   Findings Positive    Side Left    Comments painful with right internal rotation; positive left scour      other   Comments no immediate change with leg pull      Bed Mobility   Rolling Right --   difficult   Rolling Left --   difficult                     Objective measurements completed on examination: See above findings.               PT Education - 05/26/20 1703    Education Details SKTC; lumbar rotation; supine HS stretch with strap; foot on 2nd step hip flexor stretch; daily walking around the block    Person(s) Educated Patient    Methods Explanation;Demonstration;Handout    Comprehension Returned demonstration;Verbalized understanding            PT Short Term Goals - 05/26/20 1718      PT SHORT TERM GOAL #1   Title The patient will demonstrate compliance with basic lumbar and hip mobility and strengthening HEP and initiate a walking program    Time 6    Period Weeks    Status New    Target Date 07/07/20      PT SHORT TERM  GOAL #2   Title The patient will report a 25% improvement with turning bed and stooping to pick up something    Time 6    Period Weeks    Status New      PT SHORT TERM GOAL #3   Title FOTO functional outcome score improved to 50%    Time 6    Period Weeks    Status New      PT SHORT TERM GOAL #4    Title Improved bil HS lengths to 55 degrees and hip extension to 15 degrees needed for improved gait and transitional movements    Time 6    Period Weeks    Status New             PT Long Term Goals - 05/26/20 1722      PT LONG TERM GOAL #1   Title The patient will be independent in safe self progression of HEP and/or community based ex program    Time 12    Period Weeks    Status New    Target Date 08/18/20      PT LONG TERM GOAL #2   Title The patient will report a 50% improvement with turning in bed, stooping forward, and transitioning from sit to stand (facing forward rather than turning to the side)    Time 12    Period Weeks    Status New      PT LONG TERM GOAL #3   Title The patient will have improved trunk and LE strength to grossly 4+/5 needed to turn in bed with greater ease and minimal use of UEs with sit to stand    Time 12    Period Weeks    Status New      PT LONG TERM GOAL #4   Title The patient will be able to walk 2 blocks    Time 12    Period Weeks    Status New      PT LONG TERM GOAL #5   Title FOTO functional outcome score improved from 47% to 56% indicating improved function with less pain    Time 12    Period Weeks    Status New                  Plan - 05/26/20 1705    Clinical Impression Statement The patient has a long history of bil LBP across L4-5 region with intermittent "sharp shooting" pain posterior thigh to the knee.   The pain is intermittent but present for years and may be worsening over time.  Symptoms are worsened with bending forward, turning in bed and limits him to walking no more than a block.  He is quite painful with trunk flexion with "thigh walking" to return to upright.  Lumbar extension is very painful as well.  Painful with transitional movements like sit to stand with compensation of turning to the side of the chair and pushing up with both arms.  Turning on the table is slow and guarded as well.  Trunk strength  and hip strength grossly 4-/5 to 4/5.   Bridge is not painful but has difficulty clearing his hips.  Negative neural signs.  Bil hip stiffness in all directions.  Shortened HS and hip flexor lengths.  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 He would benefit from PT to address these deficits, relieve pain and increase his physical activity level.  Personal Factors and Comorbidities Time since onset of injury/illness/exacerbation;Fitness;Age;Comorbidity 1    Comorbidities OA hands; HTN    Examination-Activity Limitations Bed Mobility;Locomotion Level;Squat;Lift;Stand;Carry    Examination-Participation Restrictions Occupation;Community Activity;Meal Prep    Stability/Clinical Decision Making Stable/Uncomplicated    Clinical Decision Making Low    Rehab Potential Good    PT Frequency 1x / week   pt requests only 1x/week due to work schedule   PT Duration 12 weeks    PT Treatment/Interventions ADLs/Self Care Home Management;Aquatic Therapy;Cryotherapy;Electrical Stimulation;Spinal Manipulations;Manual techniques;Dry needling    PT Next Visit Plan try ES/heat to determine usefulness for home;  possible DN to lumbar paraspinals and QL;  and multifidi if able to lie prone; lumbar neutral gapping and hip mobs; review HEP and progress but may need slow progression due to symptom irritability (larger mat table would be helpful); bridging, sit to stand from high surface    PT Home Exercise Plan [facility]    Recommended Other Services possible home TENs info    Consulted and Agree with Plan of Care Patient           Patient will benefit from skilled therapeutic intervention in order to improve the following deficits and impairments:  Pain,Decreased range of motion,Impaired flexibility,Decreased strength,Hypomobility  Visit Diagnosis: Chronic bilateral low back pain with left-sided sciatica - Plan: PT plan of care cert/re-cert  Muscle weakness  (generalized) - Plan: PT plan of care cert/re-cert  Difficulty in walking, not elsewhere classified - Plan: PT plan of care cert/re-cert     Problem List Patient Active Problem List   Diagnosis Date Noted  . Impaired glucose tolerance 04/07/2015  . History of colonic polyps 12/23/2014  . Left bundle branch block 09/06/2012  . Dyslipidemia 08/18/2008  . Coronary atherosclerosis 08/18/2008  . Essential hypertension 03/12/2007  . Osteoarthritis 03/12/2007  . Back pain 03/12/2007   Ruben Im, PT 05/26/20 5:32 PM Phone: [phone] Fax: [phone]  Alvera Singh 05/26/2020, 5:32 PM  [facility] [address] [place], [place], [address] Phone: [phone]   Fax:  [phone]  Name: [name] MRN: [id number] Date of Birth: 03-Feb-1946

## 2020-06-10 ENCOUNTER — Other Ambulatory Visit: Payer: Self-pay | Admitting: Family Medicine

## 2020-06-10 ENCOUNTER — Telehealth: Payer: Self-pay | Admitting: *Deleted

## 2020-06-10 ENCOUNTER — Other Ambulatory Visit: Payer: Self-pay

## 2020-06-10 ENCOUNTER — Ambulatory Visit: Payer: Medicare HMO

## 2020-06-10 DIAGNOSIS — G8929 Other chronic pain: Secondary | ICD-10-CM | POA: Diagnosis not present

## 2020-06-10 DIAGNOSIS — M6281 Muscle weakness (generalized): Secondary | ICD-10-CM | POA: Diagnosis not present

## 2020-06-10 DIAGNOSIS — R262 Difficulty in walking, not elsewhere classified: Secondary | ICD-10-CM

## 2020-06-10 DIAGNOSIS — M5442 Lumbago with sciatica, left side: Secondary | ICD-10-CM | POA: Diagnosis not present

## 2020-06-10 MED ORDER — MELOXICAM 7.5 MG PO TABS: 7.5000 mg | ORAL_TABLET | Freq: Every day | ORAL | 0 refills | Status: DC | PRN

## 2020-06-10 NOTE — Therapy (Signed)
[facility] [address] [place], [place], [address] Phone: [phone]   Fax:  [phone]  Physical Therapy Treatment  Patient Details  Name: [name] MRN: [id number] Date of Birth: 07/13/1945 Referring Provider (PT): Dr. Ethlyn Gallery   Encounter Date: 06/10/2020   PT End of Session - 06/10/20 1051    Visit Number 2    Date for PT Re-Evaluation 08/18/20    Authorization Type Aetna Medicare    PT Start Time 1018    PT Stop Time 1111    PT Time Calculation (min) 53 min    Activity Tolerance Patient tolerated treatment well    Behavior During Therapy [facility] for tasks assessed/performed           Past Medical History:  Diagnosis Date  . Allergy   . BACK PAIN, CHRONIC 03/12/2007  . Cataract   . Compression fracture of thoracic vertebra (HCC)    traumatic    T8  . CORONARY ARTERY DISEASE 08/18/2008  . HYPERLIPIDEMIA 08/18/2008  . HYPERTENSION 03/12/2007  . IGT (impaired glucose tolerance)   . OSTEOARTHRITIS 03/12/2007    Past Surgical History:  Procedure Laterality Date  . CARDIAC CATHETERIZATION  2010  . CATARACT EXTRACTION, BILATERAL    . COLONOSCOPY    . DENTAL SURGERY    . HERNIA REPAIR     ingunial  . POLYPECTOMY      There were no vitals filed for this visit.   Subjective Assessment - 06/10/20 1019    Subjective I'm about the same.  Pulling my Lt knee to the chest, makes my Lt leg burn.  Walking short distances makes my leg burn.    Currently in Pain? Yes    Pain Score 7     Pain Location Back    Pain Orientation Left    Pain Descriptors / Indicators Shooting    Pain Type Chronic pain    Pain Onset More than a month ago    Pain Frequency Intermittent    Aggravating Factors  standing, sit to stand, turning in bed    Pain Relieving Factors Meloxicam- takes the edge off                             [facility] Adult PT Treatment/Exercise - 06/10/20 0001      Exercises    Exercises Knee/Hip;Lumbar      Lumbar Exercises: Stretches   Active Hamstring Stretch 3 reps;30 seconds    Active Hamstring Stretch Limitations supine with strap and seated    Single Knee to Chest Stretch 3 reps;20 seconds    Lower Trunk Rotation 3 reps;30 seconds    Hip Flexor Stretch 3 reps;20 seconds      Lumbar Exercises: Aerobic   Nustep Level 1x 6 minutes -arms and legs- PT present to monitor for pain      Modalities   Modalities Electrical Stimulation;Moist Heat      Moist Heat Therapy   Number Minutes Moist Heat 15 Minutes    Moist Heat Location Lumbar Spine      Electrical Stimulation   Electrical Stimulation Location Low back and Lt gluteals    Electrical Stimulation Action IFC    Electrical Stimulation Parameters 15 minutes    Electrical Stimulation Goals Pain                  PT Education - 06/10/20 1044    Education Details Access Code: [employer]  Person(s) Educated Patient    Methods Explanation;Demonstration;Handout    Comprehension Verbalized understanding;Returned demonstration            PT Short Term Goals - 05/26/20 1718      PT SHORT TERM GOAL #1   Title The patient will demonstrate compliance with basic lumbar and hip mobility and strengthening HEP and initiate a walking program    Time 6    Period Weeks    Status New    Target Date 07/07/20      PT SHORT TERM GOAL #2   Title The patient will report a 25% improvement with turning bed and stooping to pick up something    Time 6    Period Weeks    Status New      PT SHORT TERM GOAL #3   Title FOTO functional outcome score improved to 50%    Time 6    Period Weeks    Status New      PT SHORT TERM GOAL #4   Title Improved bil HS lengths to 55 degrees and hip extension to 15 degrees needed for improved gait and transitional movements    Time 6    Period Weeks    Status New             PT Long Term Goals - 05/26/20 1722      PT LONG TERM GOAL #1   Title The patient will  be independent in safe self progression of HEP and/or community based ex program    Time 12    Period Weeks    Status New    Target Date 08/18/20      PT LONG TERM GOAL #2   Title The patient will report a 50% improvement with turning in bed, stooping forward, and transitioning from sit to stand (facing forward rather than turning to the side)    Time 12    Period Weeks    Status New      PT LONG TERM GOAL #3   Title The patient will have improved trunk and LE strength to grossly 4+/5 needed to turn in bed with greater ease and minimal use of UEs with sit to stand    Time 12    Period Weeks    Status New      PT LONG TERM GOAL #4   Title The patient will be able to walk 2 blocks    Time 12    Period Weeks    Status New      PT LONG TERM GOAL #5   Title FOTO functional outcome score improved from 47% to 56% indicating improved function with less pain    Time 12    Period Weeks    Status New                 Plan - 06/10/20 1034    Clinical Impression Statement Pt arrives for first time follow-up today and denies any change in pain.  Session spent on review of HEP for gentle flexibility and trial of electrical stimulation for pain management.  Pt demonstrates slow mobility and is able to demonstrate all aspects of HEP well today with minor cueing for alignment and technique.  PT provided pt with information on Home TENS unit and how to order.  Pt with some relief of pain post session.  Pt is limited in standing exercise due to Lt LE pain in this position.  Pt will continue to benefit from  skilled PT to address chronic pain and Lt LE radiculopathy.    PT Frequency 1x / week    PT Duration 12 weeks    PT Treatment/Interventions ADLs/Self Care Home Management;Aquatic Therapy;Cryotherapy;Electrical Stimulation;Spinal Manipulations;Manual techniques;Dry needling    PT Next Visit Plan see how pt did after electrical stimulation and if he got a home unit, gentle advancment of  exercise/mobility as tolerated    PT Home Exercise Plan [email or web address]    Recommended Other Services [email or web address]    Consulted and Agree with Plan of Care Patient           Patient will benefit from skilled therapeutic intervention in order to improve the following deficits and impairments:  Pain,Decreased range of motion,Impaired flexibility,Decreased strength,Hypomobility  Visit Diagnosis: Muscle weakness (generalized)  Chronic bilateral low back pain with left-sided sciatica  Difficulty in walking, not elsewhere classified     Problem List Patient Active Problem List   Diagnosis Date Noted  . Impaired glucose tolerance 04/07/2015  . History of colonic polyps 12/23/2014  . Left bundle branch block 09/06/2012  . Dyslipidemia 08/18/2008  . Coronary atherosclerosis 08/18/2008  . Essential hypertension 03/12/2007  . Osteoarthritis 03/12/2007  . Back pain 03/12/2007     Sigurd Sos, PT 06/10/20 10:52 AM  [facility] [address], [place], [place], [address] Phone: [phone]   Fax:  [phone]  Name: [name] MRN: [id number] Date of Birth: 08-Oct-1945

## 2020-06-10 NOTE — Telephone Encounter (Signed)
I went ahead and sent this in for him.

## 2020-06-10 NOTE — Telephone Encounter (Signed)
[facility] faxed a refill request for Meloxicam 7.5mg .  Message sent to PCP as Rx is not on the pts current med list.

## 2020-06-10 NOTE — Patient Instructions (Signed)
Access Code: [facility] URL: [email or web address] Date: 06/10/2020 Prepared by: Claiborne Billings  Exercises Hooklying Single Knee to Chest Stretch - 1 x daily - 7 x weekly - 1 sets - 3 reps - 30 hold Supine Lower Trunk Rotation - 1 x daily - 7 x weekly - 1 sets - 3 reps - 30 hold Hooklying Hamstring Stretch with Strap - 1 x daily - 7 x weekly - 1 sets - 30 hold Hip Flexor Stretch on Step - 1 x daily - 7 x weekly - 2 sets - 5 reps Seated Hamstring Stretch - 2 x daily - 7 x weekly - 1 sets - 3 reps - 20 hold

## 2020-06-11 NOTE — Telephone Encounter (Signed)
Noted  

## 2020-06-17 ENCOUNTER — Other Ambulatory Visit: Payer: Self-pay

## 2020-06-17 ENCOUNTER — Ambulatory Visit: Payer: Medicare HMO | Attending: Family Medicine | Admitting: Physical Therapy

## 2020-06-17 ENCOUNTER — Encounter: Payer: Self-pay | Admitting: Physical Therapy

## 2020-06-17 DIAGNOSIS — R262 Difficulty in walking, not elsewhere classified: Secondary | ICD-10-CM

## 2020-06-17 DIAGNOSIS — M5442 Lumbago with sciatica, left side: Secondary | ICD-10-CM | POA: Diagnosis present

## 2020-06-17 DIAGNOSIS — G8929 Other chronic pain: Secondary | ICD-10-CM | POA: Insufficient documentation

## 2020-06-17 DIAGNOSIS — M6281 Muscle weakness (generalized): Secondary | ICD-10-CM | POA: Insufficient documentation

## 2020-06-17 NOTE — Patient Instructions (Signed)

## 2020-06-17 NOTE — Therapy (Signed)
[facility] [address], [place], [place], [address] Phone: [phone]   Fax:  [phone]  Physical Therapy Treatment  Patient Details  Name: [name] MRN: [id number] Date of Birth: 27-Mar-1946 Referring Provider (PT): Dr. Ethlyn Gallery   Encounter Date: 06/17/2020   PT End of Session - 06/17/20 1017    Visit Number 3    Date for PT Re-Evaluation 08/18/20    Authorization Type [employer] Medicare    PT Start Time 7341    PT Stop Time 1108    PT Time Calculation (min) 53 min    Activity Tolerance Patient tolerated treatment well    Behavior During Therapy [facility] for tasks assessed/performed           Past Medical History:  Diagnosis Date  . Allergy   . BACK PAIN, CHRONIC 03/12/2007  . Cataract   . Compression fracture of thoracic vertebra (HCC)    traumatic    T8  . CORONARY ARTERY DISEASE 08/18/2008  . HYPERLIPIDEMIA 08/18/2008  . HYPERTENSION 03/12/2007  . IGT (impaired glucose tolerance)   . OSTEOARTHRITIS 03/12/2007    Past Surgical History:  Procedure Laterality Date  . CARDIAC CATHETERIZATION  2010  . CATARACT EXTRACTION, BILATERAL    . COLONOSCOPY    . DENTAL SURGERY    . HERNIA REPAIR     ingunial  . POLYPECTOMY      There were no vitals filed for this visit.   Subjective Assessment - 06/17/20 1018    Subjective I've been feeling pretty good doing the exercises, but then I woke up this morning and just hurt.    Pertinent History OA in fingers and stopped playing golf;  "I'm a rule follower so if you tell me to do something I'll do it";  declines aquatic PT at this time b/c it would interfere with work    Patient Stated Goals stop hurting; improve quality of life; the hands hurt for golf but I would probably play more    Currently in Pain? Yes    Pain Score 6     Pain Location Back    Pain Orientation Left;Right    Pain Descriptors / Indicators Throbbing    Pain Type Chronic pain                              [facility] Adult PT Treatment/Exercise - 06/17/20 0001      Lumbar Exercises: Aerobic   Nustep Level 1x 7 minutes -arms and legs- PT present to monitor for pain      Modalities   Modalities Moist Heat      Moist Heat Therapy   Number Minutes Moist Heat 10 Minutes    Moist Heat Location Lumbar Spine;Hip      Manual Therapy   Manual Therapy Soft tissue mobilization    Manual therapy comments Skilled palpation and monitoring of soft tissues during DN    Soft tissue mobilization to bil gluteals and lumbar            Trigger Point Dry Needling - 06/17/20 0001    Consent Given? Yes    Education Handout Provided Yes    Muscles Treated Back/Hip Gluteus minimus;Gluteus medius;Gluteus maximus;Piriformis;Lumbar multifidi    Dry Needling Comments bil    Gluteus Minimus Response Twitch response elicited;Palpable increased muscle length    Gluteus Medius Response Twitch response elicited;Palpable increased muscle length    Gluteus Maximus Response Twitch  response elicited;Palpable increased muscle length    Piriformis Response Twitch response elicited;Palpable increased muscle length    Lumbar multifidi Response Palpable increased muscle length;Twitch response elicited                PT Education - 06/17/20 1805    Education Details DN Education and aftercare    Person(s) Educated Patient    Methods Explanation;Demonstration;Handout    Comprehension Verbalized understanding;Returned demonstration            PT Short Term Goals - 05/26/20 1718      PT SHORT TERM GOAL #1   Title The patient will demonstrate compliance with basic lumbar and hip mobility and strengthening HEP and initiate a walking program    Time 6    Period Weeks    Status New    Target Date 07/07/20      PT SHORT TERM GOAL #2   Title The patient will report a 25% improvement with turning bed and stooping to pick up something    Time 6    Period Weeks    Status New       PT SHORT TERM GOAL #3   Title FOTO functional outcome score improved to 50%    Time 6    Period Weeks    Status New      PT SHORT TERM GOAL #4   Title Improved bil HS lengths to 55 degrees and hip extension to 15 degrees needed for improved gait and transitional movements    Time 6    Period Weeks    Status New             PT Long Term Goals - 05/26/20 1722      PT LONG TERM GOAL #1   Title The patient will be independent in safe self progression of HEP and/or community based ex program    Time 12    Period Weeks    Status New    Target Date 08/18/20      PT LONG TERM GOAL #2   Title The patient will report a 50% improvement with turning in bed, stooping forward, and transitioning from sit to stand (facing forward rather than turning to the side)    Time 12    Period Weeks    Status New      PT LONG TERM GOAL #3   Title The patient will have improved trunk and LE strength to grossly 4+/5 needed to turn in bed with greater ease and minimal use of UEs with sit to stand    Time 12    Period Weeks    Status New      PT LONG TERM GOAL #4   Title The patient will be able to walk 2 blocks    Time 12    Period Weeks    Status New      PT LONG TERM GOAL #5   Title FOTO functional outcome score improved from 47% to 56% indicating improved function with less pain    Time 12    Period Weeks    Status New                 Plan - 06/17/20 1805    Clinical Impression Statement Patient reporting increased pain today in low back right > left for no reason he can think of. He did acquire a TENS unit and has been using it with good results. Due to increased pain we did initial  trial of DN today in bil gluteals and lumbar with very good results bil. LTGs are ongoing.    Personal Factors and Comorbidities Time since onset of injury/illness/exacerbation;Fitness;Age;Comorbidity 1    Comorbidities OA hands; HTN    Examination-Activity Limitations Bed Mobility;Locomotion  Level;Squat;Lift;Stand;Carry    PT Frequency 1x / week    PT Duration 12 weeks    PT Treatment/Interventions ADLs/Self Care Home Management;Aquatic Therapy;Cryotherapy;Electrical Stimulation;Spinal Manipulations;Manual techniques;Dry needling    PT Next Visit Plan assess response to DN and continue as indicated;  gentle advancment of exercise/mobility as tolerated    PT Home Exercise Plan [email or web address]    Consulted and Agree with Plan of Care Patient           Patient will benefit from skilled therapeutic intervention in order to improve the following deficits and impairments:  Pain,Decreased range of motion,Impaired flexibility,Decreased strength,Hypomobility  Visit Diagnosis: Muscle weakness (generalized)  Chronic bilateral low back pain with left-sided sciatica  Difficulty in walking, not elsewhere classified     Problem List Patient Active Problem List   Diagnosis Date Noted  . Impaired glucose tolerance 04/07/2015  . History of colonic polyps 12/23/2014  . Left bundle branch block 09/06/2012  . Dyslipidemia 08/18/2008  . Coronary atherosclerosis 08/18/2008  . Essential hypertension 03/12/2007  . Osteoarthritis 03/12/2007  . Back pain 03/12/2007   Madelyn Flavors PT 06/17/2020, 6:09 PM  [facility] [address] [place], [place], [address] Phone: [phone]   Fax:  [phone]  Name: [name] MRN: [id number] Date of Birth: 1945/08/26

## 2020-06-19 ENCOUNTER — Other Ambulatory Visit: Payer: Self-pay | Admitting: Family Medicine

## 2020-06-20 ENCOUNTER — Other Ambulatory Visit: Payer: Self-pay | Admitting: Family Medicine

## 2020-06-24 ENCOUNTER — Ambulatory Visit: Payer: Medicare HMO

## 2020-06-24 ENCOUNTER — Other Ambulatory Visit: Payer: Self-pay

## 2020-06-24 DIAGNOSIS — M6281 Muscle weakness (generalized): Secondary | ICD-10-CM | POA: Diagnosis not present

## 2020-06-24 DIAGNOSIS — M5442 Lumbago with sciatica, left side: Secondary | ICD-10-CM

## 2020-06-24 DIAGNOSIS — G8929 Other chronic pain: Secondary | ICD-10-CM | POA: Diagnosis not present

## 2020-06-24 DIAGNOSIS — R262 Difficulty in walking, not elsewhere classified: Secondary | ICD-10-CM

## 2020-06-24 NOTE — Therapy (Addendum)
[facility] [address], [place], [place], [address] Phone: [phone]   Fax:  [phone]  Physical Therapy Treatment  Patient Details  Name: Myan Suit MRN: [id number] Date of Birth: 1945/07/04 Referring Provider (PT): Dr. Ethlyn Gallery   Encounter Date: 06/24/2020   PT End of Session - 06/24/20 1007    Visit Number 4    Date for PT Re-Evaluation 08/18/20    Authorization Type [employer] Medicare    PT Start Time [phone]    PT Stop Time 1013    PT Time Calculation (min) 42 min    Activity Tolerance Patient tolerated treatment well    Behavior During Therapy [facility] for tasks assessed/performed           Past Medical History:  Diagnosis Date  . Allergy   . BACK PAIN, CHRONIC 03/12/2007  . Cataract   . Compression fracture of thoracic vertebra (HCC)    traumatic    T8  . CORONARY ARTERY DISEASE 08/18/2008  . HYPERLIPIDEMIA 08/18/2008  . HYPERTENSION 03/12/2007  . IGT (impaired glucose tolerance)   . OSTEOARTHRITIS 03/12/2007    Past Surgical History:  Procedure Laterality Date  . CARDIAC CATHETERIZATION  2010  . CATARACT EXTRACTION, BILATERAL    . COLONOSCOPY    . DENTAL SURGERY    . HERNIA REPAIR     ingunial  . POLYPECTOMY      There were no vitals filed for this visit.   Subjective Assessment - 06/24/20 0929    Subjective I really hurt after the needling.  The pain at my beltline was more intense.  I am not having LE pain now.    Currently in Pain? Yes    Pain Score 7     Pain Location Back    Pain Orientation Right;Left    Pain Descriptors / Indicators Throbbing    Pain Type Chronic pain    Pain Onset More than a month ago    Pain Frequency Intermittent    Aggravating Factors  standing, sit to stand, after needling    Pain Relieving Factors TENs unit                             [facility] Adult PT Treatment/Exercise - 06/24/20 0001      Lumbar Exercises: Stretches   Active Hamstring  Stretch 3 reps;30 seconds    Active Hamstring Stretch Limitations supine with strap and seated- max tactile and verbal cues for positioning and hold    Piriformis Stretch 3 reps;20 seconds      Lumbar Exercises: Aerobic   Nustep Level 2x 8 minutes -arms and legs- PT present to monitor for pain      Lumbar Exercises: Supine   Ab Set 5 reps      Lumbar Exercises: Sidelying   Clam Both;10 reps    Clam Limitations TA activation      Manual Therapy   Manual Therapy Soft tissue mobilization    Soft tissue mobilization addaday to bil gluteals and lumbar                  PT Education - 06/24/20 1001    Education Details Access Code: [employer]    Person(s) Educated Patient    Methods Explanation;Demonstration;Handout    Comprehension Verbalized understanding;Returned demonstration            PT Short Term Goals - 06/24/20 0940      PT SHORT  TERM GOAL #1   Title The patient will demonstrate compliance with basic lumbar and hip mobility and strengthening HEP and initiate a walking program    Status On-going      PT SHORT TERM GOAL #2   Title The patient will report a 25% improvement with turning bed and stooping to pick up something    Baseline no change in symptoms             PT Long Term Goals - 05/26/20 1722      PT LONG TERM GOAL #1   Title The patient will be independent in safe self progression of HEP and/or community based ex program    Time 12    Period Weeks    Status New    Target Date 08/18/20      PT LONG TERM GOAL #2   Title The patient will report a 50% improvement with turning in bed, stooping forward, and transitioning from sit to stand (facing forward rather than turning to the side)    Time 12    Period Weeks    Status New      PT LONG TERM GOAL #3   Title The patient will have improved trunk and LE strength to grossly 4+/5 needed to turn in bed with greater ease and minimal use of UEs with sit to stand    Time 12    Period Weeks     Status New      PT LONG TERM GOAL #4   Title The patient will be able to walk 2 blocks    Time 12    Period Weeks    Status New      PT LONG TERM GOAL #5   Title FOTO functional outcome score improved from 47% to 56% indicating improved function with less pain    Time 12    Period Weeks    Status New                 Plan - 06/24/20 0955    Clinical Impression Statement Pt denies any improvements in symptoms since the start of care.  Pt experienced 2 days of pain after dry needling and reported that pain in the lumbar spine was more intense.  Pt denies any radicular symptoms at this time so pain seems to be centralizing.   He did acquire a TENS unit and has been using it with good results. Pt required max verbal and tactile cueing for seated hamstring stretch and was ultimately able to demonstrate correctly.  Pt with significant hamstring and gluteal/hip tightness with limited motion bilaterally.  PT added hip ER strength and sidelying clam for core and hip activation.  Pt with tension in bil lumbar spine and gluteals and responded well to Addaday mobilization today.  Pt will continue to benefit from skilled PT to address lumbopelvic strength, flexibility and tissue mobilization.    PT Frequency 1x / week    PT Duration 12 weeks    PT Treatment/Interventions ADLs/Self Care Home Management;Aquatic Therapy;Cryotherapy;Electrical Stimulation;Spinal Manipulations;Manual techniques;Dry needling    PT Next Visit Plan review new HEP, Addaday if helpful, strength and endurance, flexibility.  If no change in 2 visits, possible referral back to MD.    [place]    Recommended Other Services initial cert is signed.    Consulted and Agree with Plan of Care Patient           Patient will benefit from skilled therapeutic intervention  in order to improve the following deficits and impairments:  Pain,Decreased range of motion,Impaired flexibility,Decreased  strength,Hypomobility  Visit Diagnosis: Muscle weakness (generalized)  Chronic bilateral low back pain with left-sided sciatica  Difficulty in walking, not elsewhere classified     Problem List Patient Active Problem List   Diagnosis Date Noted  . Impaired glucose tolerance 04/07/2015  . History of colonic polyps 12/23/2014  . Left bundle branch block 09/06/2012  . Dyslipidemia 08/18/2008  . Coronary atherosclerosis 08/18/2008  . Essential hypertension 03/12/2007  . Osteoarthritis 03/12/2007  . Back pain 03/12/2007    Sigurd Sos, PT 06/24/20 10:18 AM PHYSICAL THERAPY DISCHARGE SUMMARY  Visits from Start of Care: 4  Current functional level related to goals / functional outcomes: See above for current status. Pt canceled remaining appts due to no change in symptoms.   Remaining deficits: See above.    Education / Equipment: HEP, Economist  Plan: Patient agrees to discharge.  Patient goals were not met. Patient is being discharged due to lack of progress.  ?????        Sigurd Sos, PT 08/27/20 9:10 AM   [facility] [address], [place], [place], [address] Phone: [phone]   Fax:  [phone]  Name: Stanislaus Kaltenbach MRN: [id number] Date of Birth: 26-Jan-1946

## 2020-06-24 NOTE — Patient Instructions (Signed)
Access Code: [facility] URL: [email or web address] Date: 06/24/2020 Prepared by: Claiborne Billings  Exercises  Supine Figure 4 Piriformis Stretch - 2 x daily - 7 x weekly - 1 sets - 3 reps - 20 hold Seated Transversus Abdominis Bracing - 1 x daily - 7 x weekly - 3 sets - 10 reps Standing Transverse Abdominis Contraction - 1 x daily - 7 x weekly - 3 sets - 10 reps Beginner Clam - 1 x daily - 7 x weekly - 2 sets - 10 reps

## 2020-07-01 ENCOUNTER — Ambulatory Visit: Payer: Medicare HMO

## 2020-09-07 ENCOUNTER — Other Ambulatory Visit: Payer: Self-pay | Admitting: Family Medicine

## 2020-09-17 ENCOUNTER — Other Ambulatory Visit: Payer: Self-pay | Admitting: Family Medicine

## 2020-12-04 ENCOUNTER — Other Ambulatory Visit: Payer: Self-pay | Admitting: Family Medicine

## 2020-12-14 ENCOUNTER — Other Ambulatory Visit: Payer: Self-pay | Admitting: Family Medicine

## 2021-03-09 ENCOUNTER — Telehealth: Payer: Self-pay | Admitting: Family Medicine

## 2021-03-09 NOTE — Telephone Encounter (Signed)
Tried calling patient to schedule Medicare Annual Wellness Visit (AWV) either virtually or in office.  No answer   Last AWV ;03/18/20 please schedule at anytime with [facility] Nurse Health Advisor 1 or 2   This should be a 45 minute visit.

## 2021-03-16 ENCOUNTER — Other Ambulatory Visit: Payer: Self-pay | Admitting: Family Medicine

## 2021-03-24 ENCOUNTER — Telehealth: Payer: Self-pay | Admitting: Family Medicine

## 2021-03-24 NOTE — Telephone Encounter (Signed)
Tried calling patient to schedule Medicare Annual Wellness Visit (AWV) either virtually or in office.   No answer   Last AWV 03/18/20  please schedule at anytime with [facility] Nurse Health Advisor 1 or 2   This should be a 45 minute visit.

## 2021-04-13 ENCOUNTER — Other Ambulatory Visit: Payer: Self-pay | Admitting: Family Medicine

## 2021-04-14 ENCOUNTER — Other Ambulatory Visit: Payer: Self-pay

## 2021-04-14 ENCOUNTER — Ambulatory Visit (INDEPENDENT_AMBULATORY_CARE_PROVIDER_SITE_OTHER): Payer: Medicare Other | Admitting: Family Medicine

## 2021-04-14 ENCOUNTER — Encounter: Payer: Self-pay | Admitting: Family Medicine

## 2021-04-14 ENCOUNTER — Ambulatory Visit (INDEPENDENT_AMBULATORY_CARE_PROVIDER_SITE_OTHER): Payer: Medicare Other

## 2021-04-14 VITALS — BP 130/80 | HR 76 | Temp 97.7°F | Ht 71.5 in | Wt 230.9 lb

## 2021-04-14 DIAGNOSIS — E785 Hyperlipidemia, unspecified: Secondary | ICD-10-CM | POA: Diagnosis not present

## 2021-04-14 DIAGNOSIS — N400 Enlarged prostate without lower urinary tract symptoms: Secondary | ICD-10-CM | POA: Diagnosis not present

## 2021-04-14 DIAGNOSIS — M79601 Pain in right arm: Secondary | ICD-10-CM

## 2021-04-14 DIAGNOSIS — I10 Essential (primary) hypertension: Secondary | ICD-10-CM

## 2021-04-14 DIAGNOSIS — R350 Frequency of micturition: Secondary | ICD-10-CM

## 2021-04-14 DIAGNOSIS — R739 Hyperglycemia, unspecified: Secondary | ICD-10-CM | POA: Diagnosis not present

## 2021-04-14 DIAGNOSIS — Z0000 Encounter for general adult medical examination without abnormal findings: Secondary | ICD-10-CM

## 2021-04-14 DIAGNOSIS — Z23 Encounter for immunization: Secondary | ICD-10-CM | POA: Diagnosis not present

## 2021-04-14 LAB — CBC WITH DIFFERENTIAL/PLATELET
Basophils Absolute: 0.1 10*3/uL (ref 0.0–0.1)
Basophils Relative: 0.9 % (ref 0.0–3.0)
Eosinophils Absolute: 0.1 10*3/uL (ref 0.0–0.7)
Eosinophils Relative: 1.6 % (ref 0.0–5.0)
HCT: 45.6 % (ref 39.0–52.0)
Hemoglobin: 15.1 g/dL (ref 13.0–17.0)
Lymphocytes Relative: 35.2 % (ref 12.0–46.0)
Lymphs Abs: 2.3 10*3/uL (ref 0.7–4.0)
MCHC: 33.1 g/dL (ref 30.0–36.0)
MCV: 89.8 fl (ref 78.0–100.0)
Monocytes Absolute: 0.6 10*3/uL (ref 0.1–1.0)
Monocytes Relative: 9.1 % (ref 3.0–12.0)
Neutro Abs: 3.5 10*3/uL (ref 1.4–7.7)
Neutrophils Relative %: 53.2 % (ref 43.0–77.0)
Platelets: 178 10*3/uL (ref 150.0–400.0)
RBC: 5.08 Mil/uL (ref 4.22–5.81)
RDW: 14.6 % (ref 11.5–15.5)
WBC: 6.5 10*3/uL (ref 4.0–10.5)

## 2021-04-14 LAB — LIPID PANEL
Cholesterol: 134 mg/dL (ref 0–200)
HDL: 38.9 mg/dL — ABNORMAL LOW (ref >39.00)
LDL Cholesterol: 75 mg/dL (ref 0–99)
NonHDL: 94.6
Total CHOL/HDL Ratio: 3
Triglycerides: 98 mg/dL (ref 0.0–149.0)
VLDL: 19.6 mg/dL (ref 0.0–40.0)

## 2021-04-14 LAB — COMPREHENSIVE METABOLIC PANEL
ALT: 21 U/L (ref 0–53)
AST: 17 U/L (ref 0–37)
Albumin: 4.5 g/dL (ref 3.5–5.2)
Alkaline Phosphatase: 90 U/L (ref 39–117)
BUN: 16 mg/dL (ref 6–23)
CO2: 29 mEq/L (ref 19–32)
Calcium: 9.4 mg/dL (ref 8.4–10.5)
Chloride: 104 mEq/L (ref 96–112)
Creatinine, Ser: 0.83 mg/dL (ref 0.40–1.50)
GFR: 85.6 mL/min (ref >60.00)
Glucose, Bld: 112 mg/dL — ABNORMAL HIGH (ref 70–99)
Potassium: 4.8 mEq/L (ref 3.5–5.1)
Sodium: 141 mEq/L (ref 135–145)
Total Bilirubin: 1.5 mg/dL — ABNORMAL HIGH (ref 0.2–1.2)
Total Protein: 6.8 g/dL (ref 6.0–8.3)

## 2021-04-14 LAB — PSA: PSA: 10.16 ng/mL — ABNORMAL HIGH (ref 0.10–4.00)

## 2021-04-14 LAB — HEMOGLOBIN A1C: Hgb A1c MFr Bld: 6.2 % (ref 4.6–6.5)

## 2021-04-14 NOTE — Progress Notes (Signed)
[name] DOB: 14-Apr-1946 Encounter date: 04/14/2021  This is a 75 y.o. male who presents for complete physical   History of present illness/Additional concerns: Last visit was one year ago.   Worried about his prostate. Feels like bladder is small. Frequent urination. 3 of best friends have prostate cancer and other has tumor on pancreas. Worse with urinary frequency through summer. Drink selzer (without sugar), tea,water. Is a sipper- through day. Urgency strong, but small amount comes out when he goes. No trouble starting stream. No leakage. Feels like emptying is complete at time of urination. Cutting off fluids after 7-8pm. Rarely drinks alcohol. If he doesn't drink in evening, then he just gets up once. If drinks in evening will get up more. No discomfort with urination.   Pain is in right upper arm. Had issues with inflammed rotator cuff in the past. This time not in shoulder, it is more in mid upper arm. More aching. Got some pain roll-on, hemp based cream that he has used for last week which has helped. Sometimes will gravitate past elbow. No known triggers for worsening of pain. Is less with sitting or standing, but when laying down to sleep has to find place to rest right arm to help it stop hurting. Denies neck pain, numbness, tingling, weakness in arm/hand. Started 2-3 mo ago. Not sure what trigger was at that time. Does feel like anti-inflammatories (sometimes) help.   Noted red spots around both ankles. Googled this and worried he had shamberg's disease. No soreness around ankles. Does get a little swelling in the feet but more with prolonged time driving/in car. 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 Does get some skin on legs, but this has been present for awhile. No leg cramping with walking.  Stopped meloxicam about 2 weeks ago just to see if he still had back back pain without it. Pain worse without it, but not as bad as it was before.   Hypertension: Losartan 50 mg  daily Hyperlipidemia: Simvastatin 20 mg daily Coronary artery disease: Aspirin 81 mg daily Repeat colonoscopy due 12/12/20  Follows yearly with dermatology, Dr. Ronnald Ramp.  Past Medical History:  Diagnosis Date   Allergy    BACK PAIN, CHRONIC 03/12/2007   Cataract    Compression fracture of thoracic vertebra ([place])    traumatic    T8   CORONARY ARTERY DISEASE 08/18/2008   HYPERLIPIDEMIA 08/18/2008   HYPERTENSION 03/12/2007   IGT (impaired glucose tolerance)    OSTEOARTHRITIS 03/12/2007   Past Surgical History:  Procedure Laterality Date   CARDIAC CATHETERIZATION  2010   CATARACT EXTRACTION, BILATERAL     COLONOSCOPY     DENTAL SURGERY     HERNIA REPAIR     ingunial   POLYPECTOMY     Allergies  Allergen Reactions   Iodine     REACTION: unspecified   Lisinopril Cough   Current Meds  Medication Sig   aspirin 81 MG tablet Take 81 mg by mouth daily. Pt takes 1- 81 mg daily   fluticasone (FLONASE) 50 MCG/ACT nasal spray SPRAY 2 SPRAYS INTO EACH NOSTRIL EVERY DAY   Ibuprofen-diphenhydrAMINE Cit (ADVIL PM PO) Take by mouth.   losartan (COZAAR) 50 MG tablet TAKE 1 TABLET BY MOUTH EVERY DAY   meloxicam (MOBIC) 7.5 MG tablet TAKE 1 TABLET BY MOUTH EVERY DAY AS NEEDED FOR PAIN   simvastatin (ZOCOR) 20 MG tablet TAKE 1 TABLET BY MOUTH EVERY DAY IN THE EVENING   Current Facility-Administered Medications for the 04/14/21 encounter (  Office Visit) with Caren Macadam, MD  Medication   0.9 %  sodium chloride infusion   Social History   Tobacco Use   Smoking status: Former    Types: Cigarettes    Quit date: 04/18/1970    Years since quitting: 51.0   Smokeless tobacco: Never   Tobacco comments:    smoked 3 yrs- 1968-1971  Substance Use Topics   Alcohol use: Yes    Alcohol/week: 14.0 standard drinks    Types: 7 Glasses of wine, 7 Standard drinks or equivalent per week   Family History  Problem Relation Age of Onset   Diabetes Mother    Renal cancer Mother    Macular  degeneration Mother    Heart disease Father    Stroke Father    Obesity Sister        she had lap band   Colon cancer Neg Hx    Colon polyps Neg Hx      Review of Systems  Constitutional:  Negative for activity change, appetite change, chills, fatigue, fever and unexpected weight change.  HENT:  Negative for congestion, ear pain, hearing loss, sinus pressure, sinus pain, sore throat and trouble swallowing.   Eyes:  Negative for pain and visual disturbance.  Respiratory:  Negative for cough, chest tightness, shortness of breath and wheezing.   Cardiovascular:  Negative for chest pain, palpitations and leg swelling.  Gastrointestinal:  Negative for abdominal distention, abdominal pain, blood in stool, constipation, diarrhea, nausea and vomiting.  Genitourinary:  Positive for frequency and urgency. Negative for decreased urine volume, difficulty urinating, dysuria, penile pain and testicular pain.  Musculoskeletal:  Positive for arthralgias and back pain (low back; intermittent). Negative for joint swelling.  Skin:  Positive for color change (around ankles). Negative for rash.  Neurological:  Negative for dizziness, weakness, numbness and headaches.  Hematological:  Negative for adenopathy. Does not bruise/bleed easily.  Psychiatric/Behavioral:  Negative for agitation, sleep disturbance and suicidal ideas. The patient is not nervous/anxious.    CBC:  Lab Results  Component Value Date   WBC 7.3 04/08/2020   HGB 16.3 04/08/2020   HCT 48.2 04/08/2020   MCH 29.7 04/08/2020   MCHC 33.8 04/08/2020   RDW 12.9 04/08/2020   PLT 215 04/08/2020   MPV 11.7 04/08/2020   CMP: Lab Results  Component Value Date   NA 140 04/08/2020   K 4.8 04/08/2020   CL 103 04/08/2020   CO2 26 04/08/2020   GLUCOSE 109 (H) 04/08/2020   BUN 11 04/08/2020   CREATININE 0.90 04/08/2020   GFRAA  08/01/2008    >60        The eGFR has been calculated using the MDRD equation. This calculation has not  been validated in all clinical situations. eGFR's persistently <60 mL/min signify possible Chronic Kidney Disease.   CALCIUM 9.8 04/08/2020   PROT 7.3 04/08/2020   BILITOT 1.4 (H) 04/08/2020   ALKPHOS 88 03/13/2019   ALT 26 04/08/2020   AST 21 04/08/2020   LIPID: Lab Results  Component Value Date   CHOL 148 04/08/2020   TRIG 120 04/08/2020   HDL 38 (L) 04/08/2020   LDLCALC 88 04/08/2020    Objective:  BP 130/80 (BP Location: Left Arm, Patient Position: Sitting, Cuff Size: Large)    Pulse 76    Temp 97.7 F (36.5 C) (Oral)    Ht 5' 11.5" (1.816 m)    Wt 230 lb 14.4 oz (104.7 kg)    SpO2 96%  BMI 31.76 kg/m   Weight: 230 lb 14.4 oz (104.7 kg)   BP Readings from Last 3 Encounters:  04/14/21 130/80  04/08/20 126/80  03/13/19 128/78   Wt Readings from Last 3 Encounters:  04/14/21 230 lb 14.4 oz (104.7 kg)  04/08/20 237 lb 4.8 oz (107.6 kg)  03/13/19 240 lb 6.4 oz (109 kg)    Physical Exam Constitutional:      General: He is not in acute distress.    Appearance: He is well-developed.  HENT:     Head: Normocephalic and atraumatic.     Right Ear: External ear normal.     Left Ear: External ear normal.     Nose: Nose normal.     Mouth/Throat:     Pharynx: No oropharyngeal exudate.  Eyes:     Conjunctiva/sclera: Conjunctivae normal.     Pupils: Pupils are equal, round, and reactive to light.  Neck:     Thyroid: No thyromegaly.  Cardiovascular:     Rate and Rhythm: Normal rate and regular rhythm.     Heart sounds: Normal heart sounds. No murmur heard.   No friction rub. No gallop.  Pulmonary:     Effort: Pulmonary effort is normal. No respiratory distress.     Breath sounds: Normal breath sounds. No stridor. No wheezing or rales.  Abdominal:     General: Bowel sounds are normal.     Palpations: Abdomen is soft.  Genitourinary:    Penis: Normal and circumcised.      Testes: Normal.     Prostate: Enlarged (5.5).     Rectum: Guaiac result negative. No  tenderness, anal fissure, external hemorrhoid or internal hemorrhoid.  Musculoskeletal:        General: Normal range of motion.     Cervical back: Neck supple.     Comments: Limited full abduction right shoulder. Pain with external motion, pain in joint/upper arm with hawkins. No pain with bicep, tricep, abduction testing. Limited extension neck without pain.no limit/pain with flexion/side to side turn. No bicep tenderness.no external shoulder tenderness. Negative spurlings.  Skin:    General: Skin is warm and dry.  Neurological:     Mental Status: He is alert and oriented to person, place, and time.     Deep Tendon Reflexes:     Reflex Scores:      Bicep reflexes are 2+ on the right side.      Brachioradialis reflexes are 2+ on the right side.      Patellar reflexes are 1+ on the right side and 1+ on the left side.      Achilles reflexes are 1+ on the right side and 1+ on the left side. Psychiatric:        Behavior: Behavior normal.        Thought Content: Thought content normal.        Judgment: Judgment normal.    Assessment/Plan: Health Maintenance Due  Topic Date Due   COLONOSCOPY (Pts 45-69yr Insurance coverage will need to be confirmed)  12/12/2020   Health Maintenance reviewed. Overdue for repeat colonoscopy.Call GI.  1. Preventative health care Work on regular activity, healthy eating. He had gained some weight over holidays, but was doing better with this overall.   2. Urinary frequency Suspect due to enlarged prostate. If labwork negative, would do trial of flomax.  - Urinalysis with Culture Reflex - CBC with Differential/Platelet; Future  3. Right arm pain - DG Shoulder Right; Future  4. Hyperglycemia - Hemoglobin A1c; Future  5. Enlarged prostate - PSA; Future  6. Essential hypertension Well controlled. Continue with losartan 98m daily. - Comprehensive metabolic panel; Future  7. Dyslipidemia Continue with zocor 280maily; recheck bloodwork.  - Lipid  panel; Future  8. Need for pneumococcal vaccination - Pneumococcal conjugate vaccine 20-valent (Prevnar 20)  Return in about 6 months (around 10/13/2021) for Chronic condition visit.  JuMicheline RoughMD

## 2021-04-14 NOTE — Patient Instructions (Signed)
Call GI to ask about repeat colonoscopy (Dr. Havery Moros did your last colonoscopy).

## 2021-04-15 ENCOUNTER — Other Ambulatory Visit: Payer: Self-pay | Admitting: Family Medicine

## 2021-04-15 DIAGNOSIS — R972 Elevated prostate specific antigen [PSA]: Secondary | ICD-10-CM

## 2021-04-15 LAB — URINALYSIS W MICROSCOPIC + REFLEX CULTURE
Bacteria, UA: NONE SEEN /HPF
Bilirubin Urine: NEGATIVE
Glucose, UA: NEGATIVE
Hgb urine dipstick: NEGATIVE
Hyaline Cast: NONE SEEN /LPF
Ketones, ur: NEGATIVE
Leukocyte Esterase: NEGATIVE
Nitrites, Initial: NEGATIVE
RBC / HPF: NONE SEEN /HPF (ref 0–2)
Specific Gravity, Urine: 1.022 (ref 1.001–1.035)
Squamous Epithelial / HPF: NONE SEEN /HPF (ref <=5)
WBC, UA: NONE SEEN /HPF (ref 0–5)
pH: 5.5 (ref 5.0–8.0)

## 2021-04-15 LAB — NO CULTURE INDICATED

## 2021-04-15 MED ORDER — TAMSULOSIN HCL 0.4 MG PO CAPS: 0.4000 mg | ORAL_CAPSULE | Freq: Every day | ORAL | 3 refills | Status: DC

## 2021-06-03 ENCOUNTER — Telehealth: Payer: Self-pay | Admitting: Family Medicine

## 2021-06-03 NOTE — Telephone Encounter (Signed)
Tried calling patient to schedule Medicare Annual Wellness Visit (AWV) either virtually or in office.  No answer   Last AWV  03/18/20 please schedule at anytime with [facility] Nurse Health Advisor 1 or 2   This should be a 45 minute visit.

## 2021-06-08 ENCOUNTER — Other Ambulatory Visit: Payer: Self-pay | Admitting: Family Medicine

## 2021-06-15 ENCOUNTER — Ambulatory Visit (INDEPENDENT_AMBULATORY_CARE_PROVIDER_SITE_OTHER): Payer: Medicare Other

## 2021-06-15 VITALS — Ht 71.0 in | Wt 230.0 lb

## 2021-06-15 DIAGNOSIS — Z0000 Encounter for general adult medical examination without abnormal findings: Secondary | ICD-10-CM | POA: Diagnosis not present

## 2021-06-15 NOTE — Patient Instructions (Addendum)
[name] , Thank you for taking time to come for your Medicare Wellness Visit. I appreciate your ongoing commitment to your health goals. Please review the following plan we discussed and let me know if I can assist you in the future.   These are the goals we discussed:  Goals       Exercise 150 min/wk Moderate Activity (pt-stated)      Maintain current eating habits.      Weight (lb) < 200 lb (90.7 kg)      Try eating balanced meal to avoid grazing  Carrots and peanut butter or apples Drink water in the am  Controlling portions or sharing meals  Other food tips Fat free or low fat dairy products Fish high in omega-3 acids ( salmon, tuna, trout) Fruits, such as apples, bananas, oranges, pears, prunes Legumes, such as kidney beans, lentils, checkpeas, black-eyed peas and lima beans Vegetables; broccoli, cabbage, carrots Whole grains;   Plant fats are better; decrease "white" foods as pasta, rice, bread and desserts, sugar; Avoid red meat (limiting) palm and coconut oils; sugary foods and beverages  Two nutrients that raise blood chol levels are saturated fats and trans fat; in hydrogenated oils and fats, as stick margarine, baked goods (cookes, cakes, pies, crackers; frosting; and coffee creamers;   Some Fats lower cholesterol: Monounsaturated and polyunsaturated  Avocados Corn, sunflower, and soybean oils Nuts and seeds, such as walnuts Olive, canola, peanut, safflower, and sesame oils Peanut butter Salmon and trout Tofu           This is a list of the screening recommended for you and due dates:  Health Maintenance  Topic Date Due   Zoster (Shingles) Vaccine (1 of 2) 06/15/2021*   Colon Cancer Screening  06/15/2022*   Tetanus Vaccine  09/07/2021   Pneumonia Vaccine  Completed   Flu Shot  Completed   COVID-19 Vaccine  Completed   Hepatitis C Screening: USPSTF Recommendation to screen - Ages 18-79 yo.  Completed   HPV Vaccine  Aged Out  *Topic was postponed. The date  shown is not the original due date.   Advanced directives: Yes Patient will bring copy  Conditions/risks identified: None  Next appointment: Follow up in one year for your annual wellness visit.   Preventive Care 23 Years and Older, Male Preventive care refers to lifestyle choices and visits with your health care provider that can promote health and wellness. What does preventive care include? A yearly physical exam. This is also called an annual well check. Dental exams once or twice a year. Routine eye exams. Ask your health care provider how often you should have your eyes checked. Personal lifestyle choices, including: Daily care of your teeth and gums. Regular physical activity. Eating a healthy diet. Avoiding tobacco and drug use. Limiting alcohol use. Practicing safe sex. Taking low doses of aspirin every day. Taking vitamin and mineral supplements as recommended by your health care provider. What happens during an annual well check? The services and screenings done by your health care provider during your annual well check will depend on your age, overall health, lifestyle risk factors, and family history of disease. Counseling  Your health care provider may ask you questions about your: Alcohol use. Tobacco use. Drug use. Emotional well-being. Home and relationship well-being. Sexual activity. Eating habits. History of falls. Memory and ability to understand (cognition). Work and work Statistician. Screening  You may have the following tests or measurements: Height, weight, and BMI. Blood pressure. Lipid  and cholesterol levels. These may be checked every 5 years, or more frequently if you are over 46 years old. Skin check. Lung cancer screening. You may have this screening every year starting at age 20 if you have a 30-pack-year history of smoking and currently smoke or have quit within the past 15 years. Fecal occult blood test (FOBT) of the stool. You may have  this test every year starting at age 45. Flexible sigmoidoscopy or colonoscopy. You may have a sigmoidoscopy every 5 years or a colonoscopy every 10 years starting at age 90. Prostate cancer screening. Recommendations will vary depending on your family history and other risks. Hepatitis C blood test. Hepatitis B blood test. Sexually transmitted disease (STD) testing. Diabetes screening. This is done by checking your blood sugar (glucose) after you have not eaten for a while (fasting). You may have this done every 1-3 years. Abdominal aortic aneurysm (AAA) screening. You may need this if you are a current or former smoker. Osteoporosis. You may be screened starting at age 25 if you are at high risk. Talk with your health care provider about your test results, treatment options, and if necessary, the need for more tests. Vaccines  Your health care provider may recommend certain vaccines, such as: Influenza vaccine. This is recommended every year. Tetanus, diphtheria, and acellular pertussis (Tdap, Td) vaccine. You may need a Td booster every 10 years. Zoster vaccine. You may need this after age 16. Pneumococcal 13-valent conjugate (PCV13) vaccine. One dose is recommended after age 57. Pneumococcal polysaccharide (PPSV23) vaccine. One dose is recommended after age 64. Talk to your health care provider about which screenings and vaccines you need and how often you need them. This information is not intended to replace advice given to you by your health care provider. Make sure you discuss any questions you have with your health care provider. Document Released: 05/01/2015 Document Revised: 12/23/2015 Document Reviewed: 02/03/2015 Elsevier Interactive Patient Education  2017 [place] Prevention in the Home Falls can cause injuries. They can happen to people of all ages. There are many things you can do to make your home safe and to help prevent falls. What can I do on the outside of my  home? Regularly fix the edges of walkways and driveways and fix any cracks. Remove anything that might make you trip as you walk through a door, such as a raised step or threshold. Trim any bushes or trees on the path to your home. Use bright outdoor lighting. Clear any walking paths of anything that might make someone trip, such as rocks or tools. Regularly check to see if handrails are loose or broken. Make sure that both sides of any steps have handrails. Any raised decks and porches should have guardrails on the edges. Have any leaves, snow, or ice cleared regularly. Use sand or salt on walking paths during winter. Clean up any spills in your garage right away. This includes oil or grease spills. What can I do in the bathroom? Use night lights. Install grab bars by the toilet and in the tub and shower. Do not use towel bars as grab bars. Use non-skid mats or decals in the tub or shower. If you need to sit down in the shower, use a plastic, non-slip stool. Keep the floor dry. Clean up any water that spills on the floor as soon as it happens. Remove soap buildup in the tub or shower regularly. Attach bath mats securely with double-sided non-slip rug tape. Do  not have throw rugs and other things on the floor that can make you trip. What can I do in the bedroom? Use night lights. Make sure that you have a light by your bed that is easy to reach. Do not use any sheets or blankets that are too big for your bed. They should not hang down onto the floor. Have a firm chair that has side arms. You can use this for support while you get dressed. Do not have throw rugs and other things on the floor that can make you trip. What can I do in the kitchen? Clean up any spills right away. Avoid walking on wet floors. Keep items that you use a lot in easy-to-reach places. If you need to reach something above you, use a strong step stool that has a grab bar. Keep electrical cords out of the way. Do  not use floor polish or wax that makes floors slippery. If you must use wax, use non-skid floor wax. Do not have throw rugs and other things on the floor that can make you trip. What can I do with my stairs? Do not leave any items on the stairs. Make sure that there are handrails on both sides of the stairs and use them. Fix handrails that are broken or loose. Make sure that handrails are as long as the stairways. Check any carpeting to make sure that it is firmly attached to the stairs. Fix any carpet that is loose or worn. Avoid having throw rugs at the top or bottom of the stairs. If you do have throw rugs, attach them to the floor with carpet tape. Make sure that you have a light switch at the top of the stairs and the bottom of the stairs. If you do not have them, ask someone to add them for you. What else can I do to help prevent falls? Wear shoes that: Do not have high heels. Have rubber bottoms. Are comfortable and fit you well. Are closed at the toe. Do not wear sandals. If you use a stepladder: Make sure that it is fully opened. Do not climb a closed stepladder. Make sure that both sides of the stepladder are locked into place. Ask someone to hold it for you, if possible. Clearly mark and make sure that you can see: Any grab bars or handrails. First and last steps. Where the edge of each step is. Use tools that help you move around (mobility aids) if they are needed. These include: Canes. Walkers. Scooters. Crutches. Turn on the lights when you go into a dark area. Replace any light bulbs as soon as they burn out. Set up your furniture so you have a clear path. Avoid moving your furniture around. If any of your floors are uneven, fix them. If there are any pets around you, be aware of where they are. Review your medicines with your doctor. Some medicines can make you feel dizzy. This can increase your chance of falling. Ask your doctor what other things that you can do to  help prevent falls. This information is not intended to replace advice given to you by your health care provider. Make sure you discuss any questions you have with your health care provider. Document Released: 01/29/2009 Document Revised: 09/10/2015 Document Reviewed: 05/09/2014 Elsevier Interactive Patient Education  2017 [employer].

## 2021-06-15 NOTE — Progress Notes (Signed)
Subjective:   [name] is a 76 y.o. male who presents for Medicare Annual/Subsequent preventive examination.  Review of Systems    Virtual Visit via Telephone Note  I connected with  [name] on 06/15/21 at  1:15 PM EST by telephone and verified that I am speaking with the correct person using two identifiers.  Location: Patient: Home Provider: Office Persons participating in the virtual visit: patient/Nurse Health Advisor   I discussed the limitations, risks, security and privacy concerns of performing an evaluation and management service by telephone and the availability of in person appointments. The patient expressed understanding and agreed to proceed.  Interactive audio and video telecommunications were attempted between this nurse and patient, however failed, due to patient having technical difficulties OR patient did not have access to video capability.  We continued and completed visit with audio only.  Some vital signs may be absent or patient reported.   Criselda Peaches, LPN  Cardiac Risk Factors include: advanced age (>28men, >38 women);hypertension;male gender     Objective:    Today's Vitals   06/15/21 1309  Weight: 230 lb (104.3 kg)  Height: 5\' 11"  (1.803 m)   Body mass index is 32.08 kg/m.  Advanced Directives 06/15/2021 05/26/2020 03/18/2020 03/25/2016 09/23/2014  Does Patient Have a Medical Advance Directive? Yes Yes Yes Yes No  Type of Paramedic of [place];Living will Living will;Healthcare Power of [place];Living will - -  Does patient want to make changes to medical advance directive? No - Patient declined No - Patient declined No - Patient declined - -  Copy of [place] in Chart? No - copy requested No - copy requested - - -  Would patient like information on creating a medical advance directive? - - - - No - patient declined information    Current Medications  (verified) Outpatient Encounter Medications as of 06/15/2021  Medication Sig   aspirin 81 MG tablet Take 81 mg by mouth daily. Pt takes 1- 81 mg daily   fluticasone (FLONASE) 50 MCG/ACT nasal spray SPRAY 2 SPRAYS INTO EACH NOSTRIL EVERY DAY   Ibuprofen-diphenhydrAMINE Cit (ADVIL PM PO) Take by mouth.   losartan (COZAAR) 50 MG tablet TAKE 1 TABLET BY MOUTH EVERY DAY   meloxicam (MOBIC) 7.5 MG tablet TAKE 1 TABLET BY MOUTH EVERY DAY AS NEEDED FOR PAIN   simvastatin (ZOCOR) 20 MG tablet TAKE 1 TABLET BY MOUTH EVERY DAY IN THE EVENING   tamsulosin (FLOMAX) 0.4 MG CAPS capsule Take 1 capsule (0.4 mg total) by mouth daily.   Facility-Administered Encounter Medications as of 06/15/2021  Medication   0.9 %  sodium chloride infusion    Allergies (verified) Iodine and Lisinopril   History: Past Medical History:  Diagnosis Date   Allergy    BACK PAIN, CHRONIC 03/12/2007   Cataract    Compression fracture of thoracic vertebra ([place])    traumatic    T8   CORONARY ARTERY DISEASE 08/18/2008   HYPERLIPIDEMIA 08/18/2008   HYPERTENSION 03/12/2007   IGT (impaired glucose tolerance)    OSTEOARTHRITIS 03/12/2007   Past Surgical History:  Procedure Laterality Date   CARDIAC CATHETERIZATION  2010   CATARACT EXTRACTION, BILATERAL     COLONOSCOPY     DENTAL SURGERY     HERNIA REPAIR     ingunial   POLYPECTOMY     Family History  Problem Relation Age of Onset   Diabetes Mother    Renal cancer Mother  Macular degeneration Mother    Heart disease Father    Stroke Father    Obesity Sister        she had lap band   Colon cancer Neg Hx    Colon polyps Neg Hx    Social History   Socioeconomic History   Marital status: Married    Spouse name: Not on file   Number of children: Not on file   Years of education: Not on file   Highest education level: Not on file  Occupational History   Not on file  Tobacco Use   Smoking status: Former    Types: Cigarettes    Quit date: 04/18/1970    Years  since quitting: 51.1   Smokeless tobacco: Never   Tobacco comments:    smoked 3 yrs- 1968-1971  Vaping Use   Vaping Use: Never used  Substance and Sexual Activity   Alcohol use: Yes    Alcohol/week: 14.0 standard drinks    Types: 7 Glasses of wine, 7 Standard drinks or equivalent per week   Drug use: No   Sexual activity: Not on file  Other Topics Concern   Not on file  Social History Narrative   Not on file   Social Determinants of Health   Financial Resource Strain: Low Risk    Difficulty of Paying Living Expenses: Not hard at all  Food Insecurity: No Food Insecurity   Worried About Charity fundraiser in the Last Year: Never true   Ran Out of Food in the Last Year: Never true  Transportation Needs: No Transportation Needs   Lack of Transportation (Medical): No   Lack of Transportation (Non-Medical): No  Physical Activity: Inactive   Days of Exercise per Week: 0 days   Minutes of Exercise per Session: 0 min  Stress: No Stress Concern Present   Feeling of Stress : Not at all  Social Connections: Moderately Isolated   Frequency of Communication with Friends and Family: More than three times a week   Frequency of Social Gatherings with Friends and Family: More than three times a week   Attends Religious Services: Never   Marine scientist or Organizations: No   Attends Archivist Meetings: Never   Marital Status: Married    Tobacco Counseling Counseling given: Not Answered Tobacco comments: smoked 3 yrs- 1968-1971   Clinical Intake: How often do you need to have someone help you when you read instructions, pamphlets, or other written materials from your doctor or pharmacy?: 1 - Never  Diabetic?  No Interpreter Needed?: No  Activities of Daily Living In your present state of health, do you have any difficulty performing the following activities: 06/15/2021  Hearing? N  Vision? N  Difficulty concentrating or making decisions? N  Walking or climbing  stairs? N  Dressing or bathing? N  Doing errands, shopping? N  Preparing Food and eating ? N  Using the Toilet? N  In the past six months, have you accidently leaked urine? N  Do you have problems with loss of bowel control? N  Managing your Medications? N  Managing your Finances? N  Housekeeping or managing your Housekeeping? N  Some recent data might be hidden    Patient Care Team: Caren Macadam, MD as PCP - General (Family Medicine) Katy Apo, MD as Consulting Physician (Ophthalmology) Jarome Matin, MD as Consulting Physician (Dermatology)  Indicate any recent Medical Services you may have received from other than [facility] providers in the past  year (date may be approximate).     Assessment:   This is a routine wellness examination for [facility].  Hearing/Vision screen Hearing Screening - Comments:: Wears hearing aids Vision Screening - Comments:: Wears glasses. Followed by Dr Prudencio Burly  Dietary issues and exercise activities discussed: Exercise limited by: None identified   Goals Addressed               This Visit's Progress     Exercise 150 min/wk Moderate Activity (pt-stated)        Maintain current eating habits.       Depression Screen PHQ 2/9 Scores 06/15/2021 04/14/2021 04/08/2020 03/18/2020 03/13/2019 03/25/2016 04/07/2015  PHQ - 2 Score 0 0 0 0 0 0 0  PHQ- 9 Score - 0 - 0 - - -    Fall Risk Fall Risk  06/15/2021 04/14/2021 04/08/2020 03/18/2020 03/13/2019  Falls in the past year? 0 0 0 0 0  Number falls in past yr: 0 0 0 0 0  Injury with Fall? 0 - - 0 0  Risk for fall due to : No Fall Risks - - No Fall Risks -  Follow up - - - Falls evaluation completed;Falls prevention discussed -    FALL RISK PREVENTION PERTAINING TO THE HOME:  Any stairs in or around the home? Yes  If so, are there any without handrails? No  Home free of loose throw rugs in walkways, pet beds, electrical cords, etc? Yes  Adequate lighting in your home to reduce risk of falls?  Yes   ASSISTIVE DEVICES UTILIZED TO PREVENT FALLS:  Life alert? No  Use of a cane, walker or w/c? No  Grab bars in the bathroom? Yes  Shower chair or bench in shower? No  Elevated toilet seat or a handicapped toilet? Yes   TIMED UP AND GO:  Was the test performed? No .    Cognitive Function:   6CIT Screen 06/15/2021  What Year? 0 points  What month? 0 points  What time? 0 points  Count back from 20 0 points  Months in reverse 0 points  Repeat phrase 0 points  Total Score 0    Immunizations Immunization History  Administered Date(s) Administered   Fluad Quad(high Dose 65+) 12/29/2018   Influenza Split 02/14/2011   Influenza Whole 03/12/2007, 03/23/2009   Influenza, High Dose Seasonal PF 12/23/2014, 03/25/2016, 01/29/2018, 01/01/2020   Influenza-Unspecified 12/31/2018, 02/09/2021   PFIZER(Purple Top)SARS-COV-2 Vaccination 05/07/2019, 05/28/2019, 01/01/2020   PNEUMOCOCCAL CONJUGATE-20 04/14/2021   Pfizer Covid-19 Vaccine Bivalent Booster 42yrs & up 12/29/2020   Pneumococcal Conjugate-13 10/29/2013, 12/23/2014   Pneumococcal Polysaccharide-23 08/16/2011   Tdap 08/13/2010, 09/08/2011   Zoster, Live 03/22/2011    TDAP status: Up to date  Flu Vaccine status: Up to date  Pneumococcal vaccine status: Up to date  Covid-19 vaccine status: Completed vaccines  Qualifies for Shingles Vaccine? Yes   Zostavax completed No   Shingrix Completed?: No.    Education has been provided regarding the importance of this vaccine. Patient has been advised to call insurance company to determine out of pocket expense if they have not yet received this vaccine. Advised may also receive vaccine at local pharmacy or Health Dept. Verbalized acceptance and understanding.  Screening Tests Health Maintenance  Topic Date Due   Zoster Vaccines- Shingrix (1 of 2) 06/15/2021 (Originally 10/04/1995)   COLONOSCOPY (Pts 45-73yrs Insurance coverage will need to be confirmed)  06/15/2022 (Originally  12/12/2020)   TETANUS/TDAP  09/07/2021   Pneumonia Vaccine 64+ Years old  Completed   INFLUENZA VACCINE  Completed   COVID-19 Vaccine  Completed   Hepatitis C Screening  Completed   HPV VACCINES  Aged Out    Health Maintenance  There are no preventive care reminders to display for this patient.  Colonoscopy; Patient deferred  Lung Cancer Screening: (Low Dose CT Chest recommended if Age 31-80 years, 30 pack-year currently smoking OR have quit w/in 15years.) does not qualify.     Additional Screening:  Hepatitis C Screening: does qualify; Completed 04/08/20  Vision Screening: Recommended annual ophthalmology exams for early detection of glaucoma and other disorders of the eye. Is the patient up to date with their annual eye exam?  Yes  Who is the provider or what is the name of the office in which the patient attends annual eye exams? Dr Prudencio Burly If pt is not established with a provider, would they like to be referred to a provider to establish care? No .   Dental Screening: Recommended annual dental exams for proper oral hygiene  Community Resource Referral / Chronic Care Management:  CRR required this visit?  No   CCM required this visit?  No      Plan:     I have personally reviewed and noted the following in the patients chart:   Medical and social history Use of alcohol, tobacco or illicit drugs  Current medications and supplements including opioid prescriptions. Patient is not currently taking opioid prescriptions. Functional ability and status Nutritional status Physical activity Advanced directives List of other physicians Hospitalizations, surgeries, and ER visits in previous 12 months Vitals Screenings to include cognitive, depression, and falls Referrals and appointments  In addition, I have reviewed and discussed with patient certain preventive protocols, quality metrics, and best practice recommendations. A written personalized care plan for preventive  services as well as general preventive health recommendations were provided to patient.     Criselda Peaches, LPN   04/27/3157   Nurse Notes: None

## 2021-06-16 DIAGNOSIS — H5203 Hypermetropia, bilateral: Secondary | ICD-10-CM | POA: Diagnosis not present

## 2021-06-16 DIAGNOSIS — Z961 Presence of intraocular lens: Secondary | ICD-10-CM | POA: Diagnosis not present

## 2021-06-16 DIAGNOSIS — H35372 Puckering of macula, left eye: Secondary | ICD-10-CM | POA: Diagnosis not present

## 2021-06-25 DIAGNOSIS — R300 Dysuria: Secondary | ICD-10-CM | POA: Diagnosis not present

## 2021-06-28 DIAGNOSIS — N3000 Acute cystitis without hematuria: Secondary | ICD-10-CM | POA: Diagnosis not present

## 2021-06-29 ENCOUNTER — Encounter: Payer: Self-pay | Admitting: Family Medicine

## 2021-07-02 ENCOUNTER — Other Ambulatory Visit (HOSPITAL_COMMUNITY): Payer: Self-pay | Admitting: Urology

## 2021-07-02 DIAGNOSIS — C61 Malignant neoplasm of prostate: Secondary | ICD-10-CM

## 2021-07-05 DIAGNOSIS — E785 Hyperlipidemia, unspecified: Secondary | ICD-10-CM | POA: Diagnosis not present

## 2021-07-05 DIAGNOSIS — M79601 Pain in right arm: Secondary | ICD-10-CM | POA: Diagnosis not present

## 2021-07-05 DIAGNOSIS — Z0000 Encounter for general adult medical examination without abnormal findings: Secondary | ICD-10-CM | POA: Diagnosis not present

## 2021-07-05 DIAGNOSIS — Z1329 Encounter for screening for other suspected endocrine disorder: Secondary | ICD-10-CM | POA: Diagnosis not present

## 2021-07-12 ENCOUNTER — Encounter (HOSPITAL_COMMUNITY)
Admission: RE | Admit: 2021-07-12 | Discharge: 2021-07-12 | Disposition: A | Discharge status: Home / Self Care | Payer: Medicare Other | Source: Ambulatory Visit | Attending: Urology | Admitting: Urology

## 2021-07-12 ENCOUNTER — Other Ambulatory Visit: Payer: Self-pay | Admitting: Family Medicine

## 2021-07-12 ENCOUNTER — Other Ambulatory Visit: Payer: Self-pay

## 2021-07-12 DIAGNOSIS — K5730 Diverticulosis of large intestine without perforation or abscess without bleeding: Secondary | ICD-10-CM | POA: Diagnosis not present

## 2021-07-12 DIAGNOSIS — K8020 Calculus of gallbladder without cholecystitis without obstruction: Secondary | ICD-10-CM | POA: Diagnosis not present

## 2021-07-12 DIAGNOSIS — C61 Malignant neoplasm of prostate: Secondary | ICD-10-CM | POA: Diagnosis not present

## 2021-07-12 MED ORDER — TECHNETIUM TC 99M MEDRONATE IV KIT
20.0000 | PACK | Freq: Once | INTRAVENOUS | Status: AC | PRN
  Administered 2021-07-12: 20 via INTRAVENOUS

## 2021-07-16 DIAGNOSIS — R300 Dysuria: Secondary | ICD-10-CM | POA: Diagnosis not present

## 2021-07-16 DIAGNOSIS — N3000 Acute cystitis without hematuria: Secondary | ICD-10-CM | POA: Diagnosis not present

## 2021-08-02 DIAGNOSIS — N3000 Acute cystitis without hematuria: Secondary | ICD-10-CM | POA: Diagnosis not present

## 2021-08-05 ENCOUNTER — Other Ambulatory Visit: Payer: Self-pay | Admitting: Urology

## 2021-08-09 DIAGNOSIS — M6281 Muscle weakness (generalized): Secondary | ICD-10-CM | POA: Diagnosis not present

## 2021-08-09 DIAGNOSIS — M62838 Other muscle spasm: Secondary | ICD-10-CM | POA: Diagnosis not present

## 2021-08-10 ENCOUNTER — Encounter (HOSPITAL_COMMUNITY): Payer: Self-pay

## 2021-08-10 NOTE — Progress Notes (Addendum)
PCP - Anastasia Pall, MD [place] 07-05-21 epic ?Cardiologist - no  ? ?PPM/ICD -  ?Device Orders -  ?Rep Notified -  ? ?Chest x-ray -  ?EKG -  ?Stress Test -  ?ECHO -  ?Cardiac Cath -  ? ?Sleep Study -  ?CPAP -  ? ?Fasting Blood Sugar -  ?Checks Blood Sugar _____ times a day ? ?Blood Thinner Instructions: ?Aspirin Instructions: ? ?ERAS Protcol - ?PRE-SURGERY ? ? ?COVID vaccine -x4 pfizer ? ?Activity-- Able to walk a flight of stairs without SOB ?Anesthesia review:  HTN, LBBB also present on previous EKG ? ?Patient denies shortness of breath, fever, cough and chest pain at PAT appointment ? ? ?All instructions explained to the patient, with a verbal understanding of the material. Patient agrees to go over the instructions while at home for a better understanding. Patient also instructed to self quarantine after being tested for COVID-19. The opportunity to ask questions was provided. ?  ?

## 2021-08-10 NOTE — Patient Instructions (Signed)
DUE TO COVID-19 ONLY TWO VISITORS  (aged 76 and older)  ARE ALLOWED TO COME WITH YOU AND STAY IN THE WAITING ROOM ONLY DURING PRE OP AND PROCEDURE.   ?**NO VISITORS ARE ALLOWED IN THE SHORT STAY AREA OR RECOVERY ROOM!!** ? ?IF YOU WILL BE ADMITTED INTO THE HOSPITAL YOU ARE ALLOWED ONLY FOUR SUPPORT PEOPLE DURING VISITATION HOURS ONLY (7 AM -8PM)   ?The support person(s) must pass our screening, gel in and out, and wear a mask at all times, including in the patient?s room. ?Patients must also wear a mask when staff or their support person are in the room. ?Visitors GUEST BADGE MUST BE WORN VISIBLY  ?One adult visitor may remain with you overnight and MUST be in the room by 8 P.M. ?  ? ? Your procedure is scheduled on: 08-18-21 ? ? Report to [facility] Main Entrance ? ?  Report to admitting at       [place] AM ? ? Call this number if you have problems the morning of surgery [phone] ? ? Do not eat food :After Midnight. ? ? After Midnight you may have the following liquids until ______ AM/ PM DAY OF SURGERY ? ?Water ?Black Coffee (sugar ok, NO MILK/CREAM OR CREAMERS)  ?Tea (sugar ok, NO MILK/CREAM OR CREAMERS) regular and decaf                             ?Plain Jell-O (NO RED)                                           ?Fruit ices (not with fruit pulp, NO RED)                                     ?Popsicles (NO RED)                                                                  ?Juice: apple, WHITE grape, WHITE cranberry ?Sports drinks like Gatorade (NO RED) ?Clear broth(vegetable,chicken,beef) ? ?         ?       If you have questions, please contact your surgeon?s office. ? ? ?FOLLOW BOWEL PREP AND ANY ADDITIONAL PRE OP INSTRUCTIONS YOU RECEIVED FROM YOUR SURGEON'S OFFICE!!! ?  ?  ?Oral Hygiene is also important to reduce your risk of infection.                                    ?Remember - BRUSH YOUR TEETH THE MORNING OF SURGERY WITH YOUR REGULAR TOOTHPASTE ? ? Do NOT smoke after Midnight ? ? Take these  medicines the morning of surgery with A SIP OF WATER:  antibiotic ? ? ? ? ?                  ?           You may not have any metal on your body including  hair pins, jewelry, and body piercing ? ?           Do not wear  lotions, powders, perfumes/cologne, or deodorant ? ? ?            Men may shave face and neck. ? ? Do not bring valuables to the hospital. [facility] NOT ?            RESPONSIBLE   FOR VALUABLES. ? ? Contacts, dentures or bridgework may not be worn into surgery. ? ? Bring small overnight bag day of surgery. ?  ? Patients discharged on the day of surgery will not be allowed to drive home.  Someone NEEDS to stay with you for the first 24 hours after anesthesia. ? ? ?            Please read over the following fact sheets you were given: IF [place] [phone] ? ?   [place] - Preparing for Surgery ?Before surgery, you can play an important role.  Because skin is not sterile, your skin needs to be as free of germs as possible.  You can reduce the number of germs on your skin by washing with CHG (chlorahexidine gluconate) soap before surgery.  CHG is an antiseptic cleaner which kills germs and bonds with the skin to continue killing germs even after washing. ?Please DO NOT use if you have an allergy to CHG or antibacterial soaps.  If your skin becomes reddened/irritated stop using the CHG and inform your nurse when you arrive at Short Stay. ?Do not shave (including legs and underarms) for at least 48 hours prior to the first CHG shower.  You may shave your face/neck. ?Please follow these instructions carefully: ? 1.  Shower with CHG Soap the night before surgery and the  morning of Surgery. ? 2.  If you choose to wash your hair, wash your hair first as usual with your  normal  shampoo. ? 3.  After you shampoo, rinse your hair and body thoroughly to remove the  shampoo.                           4.  Use CHG as you would any other liquid soap.   You can apply chg directly  to the skin and wash  ?                     Gently with a scrungie or clean washcloth. ? 5.  Apply the CHG Soap to your body ONLY FROM THE NECK DOWN.   Do not use on face/ open      ?                     Wound or open sores. Avoid contact with eyes, ears mouth and genitals (private parts).  ?                     Production manager,  Genitals (private parts) with your normal soap. ?            6.  Wash thoroughly, paying special attention to the area where your surgery  will be performed. ? 7.  Thoroughly rinse your body with warm water from the neck down. ? 8.  DO NOT shower/wash with your normal soap after using and rinsing off  the CHG Soap. ?  9.  Pat yourself dry with a clean towel. ?           10.  Wear clean pajamas. ?           11.  Place clean sheets on your bed the night of your first shower and do not  sleep with pets. ?Day of Surgery : ?Do not apply any lotions/deodorants the morning of surgery.  Please wear clean clothes to the hospital/surgery center. ? ?FAILURE TO FOLLOW THESE INSTRUCTIONS MAY RESULT IN THE CANCELLATION OF YOUR SURGERY ?PATIENT SIGNATURE_________________________________ ? ?NURSE SIGNATURE__________________________________ ? ?________________________________________________________________________  ?

## 2021-08-10 NOTE — Progress Notes (Addendum)
Please place orders in epic pt. Is scheduled for preop   orders need a second sign please  ?

## 2021-08-12 ENCOUNTER — Other Ambulatory Visit: Payer: Self-pay

## 2021-08-12 ENCOUNTER — Encounter (HOSPITAL_COMMUNITY): Payer: Self-pay

## 2021-08-12 ENCOUNTER — Inpatient Hospital Stay (HOSPITAL_COMMUNITY): Admission: RE | Admit: 2021-08-12 | Payer: Medicare Other | Source: Ambulatory Visit

## 2021-08-12 ENCOUNTER — Encounter (HOSPITAL_COMMUNITY)
Admission: RE | Admit: 2021-08-12 | Discharge: 2021-08-12 | Disposition: A | Discharge status: Home / Self Care | Payer: Medicare Other | Source: Ambulatory Visit | Attending: Urology | Admitting: Urology

## 2021-08-12 VITALS — BP 149/80 | HR 77 | Temp 98.2°F | Resp 16 | Ht 71.0 in | Wt 226.0 lb

## 2021-08-12 DIAGNOSIS — I10 Essential (primary) hypertension: Secondary | ICD-10-CM | POA: Insufficient documentation

## 2021-08-12 DIAGNOSIS — Z01818 Encounter for other preprocedural examination: Secondary | ICD-10-CM | POA: Insufficient documentation

## 2021-08-12 HISTORY — DX: Atherosclerotic heart disease of native coronary artery without angina pectoris: I25.10

## 2021-08-12 HISTORY — DX: Malignant (primary) neoplasm, unspecified: C80.1

## 2021-08-12 HISTORY — DX: Urinary tract infection, site not specified: N39.0

## 2021-08-12 LAB — CBC
HCT: 42 % (ref 39.0–52.0)
Hemoglobin: 13.8 g/dL (ref 13.0–17.0)
MCH: 29.4 pg (ref 26.0–34.0)
MCHC: 32.9 g/dL (ref 30.0–36.0)
MCV: 89.6 fL (ref 80.0–100.0)
Platelets: 263 10*3/uL (ref 150–400)
RBC: 4.69 MIL/uL (ref 4.22–5.81)
RDW: 13.6 % (ref 11.5–15.5)
WBC: 8.5 10*3/uL (ref 4.0–10.5)
nRBC: 0 % (ref 0.0–0.2)

## 2021-08-12 LAB — BASIC METABOLIC PANEL
Anion gap: 6 (ref 5–15)
BUN: 16 mg/dL (ref 8–23)
CO2: 25 mmol/L (ref 22–32)
Calcium: 8.9 mg/dL (ref 8.9–10.3)
Chloride: 107 mmol/L (ref 98–111)
Creatinine, Ser: 0.84 mg/dL (ref 0.61–1.24)
GFR, Estimated: >60 mL/min (ref >60)
Glucose, Bld: 112 mg/dL — ABNORMAL HIGH (ref 70–99)
Potassium: 4.1 mmol/L (ref 3.5–5.1)
Sodium: 138 mmol/L (ref 135–145)

## 2021-08-18 ENCOUNTER — Ambulatory Visit (HOSPITAL_BASED_OUTPATIENT_CLINIC_OR_DEPARTMENT_OTHER): Payer: Medicare Other | Admitting: Certified Registered Nurse Anesthetist

## 2021-08-18 ENCOUNTER — Observation Stay (HOSPITAL_COMMUNITY)
Admission: RE | Admit: 2021-08-18 | Discharge: 2021-08-19 | Disposition: A | Discharge status: Home / Self Care | Payer: Medicare Other | Attending: Urology | Admitting: Urology

## 2021-08-18 ENCOUNTER — Other Ambulatory Visit: Payer: Self-pay

## 2021-08-18 ENCOUNTER — Encounter (HOSPITAL_COMMUNITY)
Admission: RE | Disposition: A | Discharge status: Home / Self Care | Payer: Self-pay | Source: Home / Self Care | Attending: Urology

## 2021-08-18 ENCOUNTER — Encounter (HOSPITAL_COMMUNITY): Payer: Self-pay | Admitting: Urology

## 2021-08-18 ENCOUNTER — Ambulatory Visit (HOSPITAL_COMMUNITY): Payer: Medicare Other | Admitting: Physician Assistant

## 2021-08-18 DIAGNOSIS — Z87891 Personal history of nicotine dependence: Secondary | ICD-10-CM | POA: Insufficient documentation

## 2021-08-18 DIAGNOSIS — E785 Hyperlipidemia, unspecified: Secondary | ICD-10-CM | POA: Diagnosis not present

## 2021-08-18 DIAGNOSIS — I10 Essential (primary) hypertension: Secondary | ICD-10-CM | POA: Diagnosis not present

## 2021-08-18 DIAGNOSIS — I2510 Atherosclerotic heart disease of native coronary artery without angina pectoris: Secondary | ICD-10-CM | POA: Diagnosis not present

## 2021-08-18 DIAGNOSIS — E668 Other obesity: Secondary | ICD-10-CM | POA: Insufficient documentation

## 2021-08-18 DIAGNOSIS — C61 Malignant neoplasm of prostate: Secondary | ICD-10-CM

## 2021-08-18 DIAGNOSIS — I447 Left bundle-branch block, unspecified: Secondary | ICD-10-CM | POA: Diagnosis not present

## 2021-08-18 DIAGNOSIS — Z79899 Other long term (current) drug therapy: Secondary | ICD-10-CM | POA: Diagnosis not present

## 2021-08-18 DIAGNOSIS — Z8349 Family history of other endocrine, nutritional and metabolic diseases: Secondary | ICD-10-CM | POA: Insufficient documentation

## 2021-08-18 DIAGNOSIS — R3915 Urgency of urination: Secondary | ICD-10-CM | POA: Diagnosis not present

## 2021-08-18 DIAGNOSIS — M1990 Unspecified osteoarthritis, unspecified site: Secondary | ICD-10-CM | POA: Diagnosis not present

## 2021-08-18 DIAGNOSIS — Z6831 Body mass index (BMI) 31.0-31.9, adult: Secondary | ICD-10-CM | POA: Diagnosis not present

## 2021-08-18 DIAGNOSIS — Z8249 Family history of ischemic heart disease and other diseases of the circulatory system: Secondary | ICD-10-CM | POA: Diagnosis not present

## 2021-08-18 DIAGNOSIS — C774 Secondary and unspecified malignant neoplasm of inguinal and lower limb lymph nodes: Secondary | ICD-10-CM | POA: Insufficient documentation

## 2021-08-18 DIAGNOSIS — G8929 Other chronic pain: Secondary | ICD-10-CM | POA: Insufficient documentation

## 2021-08-18 HISTORY — PX: LYMPH NODE DISSECTION: SHX5087

## 2021-08-18 HISTORY — PX: ROBOT ASSISTED LAPAROSCOPIC RADICAL PROSTATECTOMY: SHX5141

## 2021-08-18 LAB — HEMOGLOBIN AND HEMATOCRIT, BLOOD
HCT: 43.9 % (ref 39.0–52.0)
Hemoglobin: 14.3 g/dL (ref 13.0–17.0)

## 2021-08-18 LAB — BASIC METABOLIC PANEL
Anion gap: 10 (ref 5–15)
BUN: 18 mg/dL (ref 8–23)
CO2: 23 mmol/L (ref 22–32)
Calcium: 8.5 mg/dL — ABNORMAL LOW (ref 8.9–10.3)
Chloride: 103 mmol/L (ref 98–111)
Creatinine, Ser: 1 mg/dL (ref 0.61–1.24)
GFR, Estimated: >60 mL/min (ref >60)
Glucose, Bld: 149 mg/dL — ABNORMAL HIGH (ref 70–99)
Potassium: 4.2 mmol/L (ref 3.5–5.1)
Sodium: 136 mmol/L (ref 135–145)

## 2021-08-18 SURGERY — PROSTATECTOMY, RADICAL, ROBOT-ASSISTED, LAPAROSCOPIC
Anesthesia: General | Site: Abdomen

## 2021-08-18 MED ORDER — ACETAMINOPHEN 10 MG/ML IV SOLN
1000.0000 mg | Freq: Four times a day (QID) | INTRAVENOUS | Status: AC
Start: 2021-08-18 — End: 2021-08-19
  Administered 2021-08-18 – 2021-08-19 (×4): 1000 mg via INTRAVENOUS
  Filled 2021-08-18 (×4): qty 100

## 2021-08-18 MED ORDER — BACITRACIN-NEOMYCIN-POLYMYXIN 400-5-5000 EX OINT: 1.0000 "application " | TOPICAL_OINTMENT | Freq: Three times a day (TID) | CUTANEOUS | Status: DC | PRN

## 2021-08-18 MED ORDER — DEXAMETHASONE SODIUM PHOSPHATE 4 MG/ML IJ SOLN
INTRAMUSCULAR | Status: DC | PRN
  Administered 2021-08-18: 5 mg via INTRAVENOUS

## 2021-08-18 MED ORDER — HYDROCODONE-ACETAMINOPHEN 5-325 MG PO TABS: 1.0000 | ORAL_TABLET | Freq: Four times a day (QID) | ORAL | 0 refills | Status: AC | PRN

## 2021-08-18 MED ORDER — SODIUM CHLORIDE 0.9% FLUSH: 3.0000 mL | INTRAVENOUS | Status: DC | PRN

## 2021-08-18 MED ORDER — GENTAMICIN SULFATE 40 MG/ML IJ SOLN
5.0000 mg/kg | INTRAVENOUS | Status: AC
  Administered 2021-08-18: 510 mg via INTRAVENOUS
  Filled 2021-08-18: qty 12.75

## 2021-08-18 MED ORDER — ORAL CARE MOUTH RINSE: 15.0000 mL | Freq: Once | OROMUCOSAL | Status: AC

## 2021-08-18 MED ORDER — OXYCODONE HCL 5 MG PO TABS
5.0000 mg | ORAL_TABLET | ORAL | Status: DC | PRN
  Administered 2021-08-18 – 2021-08-19 (×3): 5 mg via ORAL
  Filled 2021-08-18 (×3): qty 1

## 2021-08-18 MED ORDER — ONDANSETRON HCL 4 MG/2ML IJ SOLN: 4.0000 mg | Freq: Once | INTRAMUSCULAR | Status: DC | PRN

## 2021-08-18 MED ORDER — FENTANYL CITRATE (PF) 100 MCG/2ML IJ SOLN
INTRAMUSCULAR | Status: DC | PRN
  Administered 2021-08-18 (×5): 50 ug via INTRAVENOUS
  Administered 2021-08-18: 100 ug via INTRAVENOUS

## 2021-08-18 MED ORDER — DOCUSATE SODIUM 100 MG PO CAPS: 100.0000 mg | ORAL_CAPSULE | Freq: Two times a day (BID) | ORAL | Status: AC

## 2021-08-18 MED ORDER — PROPOFOL 10 MG/ML IV BOLUS
INTRAVENOUS | Status: AC
Start: 2021-08-18 — End: ?
  Filled 2021-08-18: qty 20

## 2021-08-18 MED ORDER — LACTATED RINGERS IV SOLN: INTRAVENOUS | Status: DC

## 2021-08-18 MED ORDER — LIDOCAINE HCL (PF) 2 % IJ SOLN
INTRAMUSCULAR | Status: AC
  Filled 2021-08-18: qty 5

## 2021-08-18 MED ORDER — BUPIVACAINE LIPOSOME 1.3 % IJ SUSP
INTRAMUSCULAR | Status: AC
  Filled 2021-08-18: qty 20

## 2021-08-18 MED ORDER — LOSARTAN POTASSIUM 50 MG PO TABS
50.0000 mg | ORAL_TABLET | Freq: Every day | ORAL | Status: DC
  Administered 2021-08-19: 50 mg via ORAL
  Filled 2021-08-18: qty 1

## 2021-08-18 MED ORDER — ONDANSETRON HCL 4 MG/2ML IJ SOLN
4.0000 mg | INTRAMUSCULAR | Status: DC | PRN
  Administered 2021-08-18: 4 mg via INTRAVENOUS
  Filled 2021-08-18: qty 2

## 2021-08-18 MED ORDER — PROPOFOL 10 MG/ML IV BOLUS
INTRAVENOUS | Status: DC | PRN
  Administered 2021-08-18: 150 mg via INTRAVENOUS

## 2021-08-18 MED ORDER — DEXTROSE-NACL 5-0.45 % IV SOLN: INTRAVENOUS | Status: DC

## 2021-08-18 MED ORDER — ONDANSETRON HCL 4 MG/2ML IJ SOLN
INTRAMUSCULAR | Status: DC | PRN
  Administered 2021-08-18: 4 mg via INTRAVENOUS

## 2021-08-18 MED ORDER — OXYCODONE HCL 5 MG PO TABS: 5.0000 mg | ORAL_TABLET | Freq: Once | ORAL | Status: DC | PRN

## 2021-08-18 MED ORDER — SIMVASTATIN 20 MG PO TABS
20.0000 mg | ORAL_TABLET | Freq: Every evening | ORAL | Status: DC
  Administered 2021-08-18: 20 mg via ORAL
  Filled 2021-08-18: qty 1

## 2021-08-18 MED ORDER — OXYCODONE HCL 5 MG/5ML PO SOLN: 5.0000 mg | Freq: Once | ORAL | Status: DC | PRN

## 2021-08-18 MED ORDER — LACTATED RINGERS IR SOLN
Status: DC | PRN
  Administered 2021-08-18: 1000 mL

## 2021-08-18 MED ORDER — SODIUM CHLORIDE 0.9 % IV SOLN: 250.0000 mL | INTRAVENOUS | Status: DC | PRN

## 2021-08-18 MED ORDER — FENTANYL CITRATE (PF) 100 MCG/2ML IJ SOLN
INTRAMUSCULAR | Status: AC
  Filled 2021-08-18: qty 2

## 2021-08-18 MED ORDER — CEFAZOLIN SODIUM-DEXTROSE 2-4 GM/100ML-% IV SOLN
2.0000 g | INTRAVENOUS | Status: AC
  Administered 2021-08-18: 2 g via INTRAVENOUS
  Filled 2021-08-18: qty 100

## 2021-08-18 MED ORDER — PHENYLEPHRINE 80 MCG/ML (10ML) SYRINGE FOR IV PUSH (FOR BLOOD PRESSURE SUPPORT)
PREFILLED_SYRINGE | INTRAVENOUS | Status: DC | PRN
  Administered 2021-08-18: 120 ug via INTRAVENOUS
  Administered 2021-08-18 (×3): 80 ug via INTRAVENOUS

## 2021-08-18 MED ORDER — DOCUSATE SODIUM 100 MG PO CAPS
100.0000 mg | ORAL_CAPSULE | Freq: Two times a day (BID) | ORAL | Status: DC
  Administered 2021-08-18 – 2021-08-19 (×2): 100 mg via ORAL
  Filled 2021-08-18 (×2): qty 1

## 2021-08-18 MED ORDER — ACETAMINOPHEN 500 MG PO TABS: 1000.0000 mg | ORAL_TABLET | Freq: Once | ORAL | Status: AC

## 2021-08-18 MED ORDER — FENTANYL CITRATE (PF) 250 MCG/5ML IJ SOLN
INTRAMUSCULAR | Status: AC
  Filled 2021-08-18: qty 5

## 2021-08-18 MED ORDER — LIDOCAINE 2% (20 MG/ML) 5 ML SYRINGE
INTRAMUSCULAR | Status: DC | PRN
  Administered 2021-08-18: 100 mg via INTRAVENOUS

## 2021-08-18 MED ORDER — ROCURONIUM BROMIDE 10 MG/ML (PF) SYRINGE
PREFILLED_SYRINGE | INTRAVENOUS | Status: DC | PRN
  Administered 2021-08-18: 100 mg via INTRAVENOUS
  Administered 2021-08-18: 30 mg via INTRAVENOUS

## 2021-08-18 MED ORDER — ACETAMINOPHEN 500 MG PO TABS
ORAL_TABLET | ORAL | Status: AC
Start: 2021-08-18 — End: 2021-08-18
  Administered 2021-08-18: 1000 mg via ORAL
  Filled 2021-08-18: qty 2

## 2021-08-18 MED ORDER — PHENYLEPHRINE 80 MCG/ML (10ML) SYRINGE FOR IV PUSH (FOR BLOOD PRESSURE SUPPORT)
PREFILLED_SYRINGE | INTRAVENOUS | Status: AC
  Filled 2021-08-18: qty 10

## 2021-08-18 MED ORDER — DEXAMETHASONE SODIUM PHOSPHATE 10 MG/ML IJ SOLN
INTRAMUSCULAR | Status: AC
  Filled 2021-08-18: qty 1

## 2021-08-18 MED ORDER — SODIUM CHLORIDE (PF) 0.9 % IJ SOLN
INTRAMUSCULAR | Status: AC
  Filled 2021-08-18: qty 20

## 2021-08-18 MED ORDER — SENNOSIDES-DOCUSATE SODIUM 8.6-50 MG PO TABS
2.0000 | ORAL_TABLET | Freq: Every day | ORAL | Status: DC
  Administered 2021-08-18: 2 via ORAL
  Filled 2021-08-18: qty 2

## 2021-08-18 MED ORDER — POLYETHYLENE GLYCOL 3350 17 GM/SCOOP PO POWD
1.0000 | Freq: Once | ORAL | Status: DC
  Filled 2021-08-18: qty 255

## 2021-08-18 MED ORDER — CEFAZOLIN SODIUM-DEXTROSE 1-4 GM/50ML-% IV SOLN
1.0000 g | Freq: Three times a day (TID) | INTRAVENOUS | Status: AC
  Administered 2021-08-18 – 2021-08-19 (×2): 1 g via INTRAVENOUS
  Filled 2021-08-18 (×2): qty 50

## 2021-08-18 MED ORDER — AMOXICILLIN-POT CLAVULANATE 500-125 MG PO TABS: 1.0000 | ORAL_TABLET | Freq: Two times a day (BID) | ORAL | 0 refills | Status: AC

## 2021-08-18 MED ORDER — ROCURONIUM BROMIDE 10 MG/ML (PF) SYRINGE
PREFILLED_SYRINGE | INTRAVENOUS | Status: AC
  Filled 2021-08-18: qty 10

## 2021-08-18 MED ORDER — ONDANSETRON HCL 4 MG/2ML IJ SOLN
INTRAMUSCULAR | Status: AC
  Filled 2021-08-18: qty 2

## 2021-08-18 MED ORDER — SODIUM CHLORIDE (PF) 0.9 % IJ SOLN
INTRAMUSCULAR | Status: DC | PRN
  Administered 2021-08-18: 20 mL

## 2021-08-18 MED ORDER — FENTANYL CITRATE PF 50 MCG/ML IJ SOSY
25.0000 ug | PREFILLED_SYRINGE | INTRAMUSCULAR | Status: DC | PRN
  Administered 2021-08-18 (×3): 50 ug via INTRAVENOUS

## 2021-08-18 MED ORDER — SODIUM CHLORIDE 0.9% FLUSH
3.0000 mL | Freq: Two times a day (BID) | INTRAVENOUS | Status: DC
  Administered 2021-08-18 – 2021-08-19 (×2): 3 mL via INTRAVENOUS

## 2021-08-18 MED ORDER — SUGAMMADEX SODIUM 200 MG/2ML IV SOLN
INTRAVENOUS | Status: DC | PRN
  Administered 2021-08-18: 200 mg via INTRAVENOUS

## 2021-08-18 MED ORDER — HYDROMORPHONE HCL 1 MG/ML IJ SOLN
0.5000 mg | INTRAMUSCULAR | Status: DC | PRN
  Administered 2021-08-18 (×2): 1 mg via INTRAVENOUS
  Filled 2021-08-18 (×2): qty 1

## 2021-08-18 MED ORDER — BUPIVACAINE LIPOSOME 1.3 % IJ SUSP
INTRAMUSCULAR | Status: DC | PRN
  Administered 2021-08-18: 40 mL

## 2021-08-18 MED ORDER — AMISULPRIDE (ANTIEMETIC) 5 MG/2ML IV SOLN: 10.0000 mg | Freq: Once | INTRAVENOUS | Status: DC | PRN

## 2021-08-18 MED ORDER — CHLORHEXIDINE GLUCONATE 0.12 % MT SOLN
15.0000 mL | Freq: Once | OROMUCOSAL | Status: AC
  Administered 2021-08-18: 15 mL via OROMUCOSAL

## 2021-08-18 MED ORDER — KETOROLAC TROMETHAMINE 15 MG/ML IJ SOLN
15.0000 mg | Freq: Four times a day (QID) | INTRAMUSCULAR | Status: DC
  Administered 2021-08-18 – 2021-08-19 (×4): 15 mg via INTRAVENOUS
  Filled 2021-08-18 (×4): qty 1

## 2021-08-18 SURGICAL SUPPLY — 84 items
ADH SKN CLS APL DERMABOND .7 (GAUZE/BANDAGES/DRESSINGS) ×2
APL PRP STRL LF DISP 70% ISPRP (MISCELLANEOUS) ×2
APL SWBSTK 6 STRL LF DISP (MISCELLANEOUS) ×2
APPLICATOR COTTON TIP 6 STRL (MISCELLANEOUS) ×2 IMPLANT
APPLICATOR COTTON TIP 6IN STRL (MISCELLANEOUS) ×3
BAG COUNTER SPONGE SURGICOUNT (BAG) IMPLANT
BAG RETRIEVAL 10 (BASKET) ×1
BAG SPNG CNTER NS LX DISP (BAG)
CATH FOLEY 2WAY SLVR 18FR 30CC (CATHETERS) ×3 IMPLANT
CATH TIEMANN FOLEY 18FR 5CC (CATHETERS) ×3 IMPLANT
CHLORAPREP W/TINT 26 (MISCELLANEOUS) ×3 IMPLANT
CLIP LIGATING HEM O LOK PURPLE (MISCELLANEOUS) ×13 IMPLANT
CNTNR URN SCR LID CUP LEK RST (MISCELLANEOUS) ×2 IMPLANT
CONT SPEC 4OZ STRL OR WHT (MISCELLANEOUS) ×3
COVER SURGICAL LIGHT HANDLE (MISCELLANEOUS) ×3 IMPLANT
COVER TIP SHEARS 8 DVNC (MISCELLANEOUS) ×2 IMPLANT
COVER TIP SHEARS 8MM DA VINCI (MISCELLANEOUS) ×3
CUTTER ECHEON FLEX ENDO 45 340 (ENDOMECHANICALS) ×3 IMPLANT
CUTTER FLEX LINEAR 45M (STAPLE) ×1 IMPLANT
DERMABOND ADVANCED (GAUZE/BANDAGES/DRESSINGS) ×1
DERMABOND ADVANCED .7 DNX12 (GAUZE/BANDAGES/DRESSINGS) ×2 IMPLANT
DRAIN CHANNEL RND F F (WOUND CARE) IMPLANT
DRAPE ARM DVNC X/XI (DISPOSABLE) ×8 IMPLANT
DRAPE COLUMN DVNC XI (DISPOSABLE) ×2 IMPLANT
DRAPE DA VINCI XI ARM (DISPOSABLE) ×12
DRAPE DA VINCI XI COLUMN (DISPOSABLE) ×3
DRAPE SURG IRRIG POUCH 19X23 (DRAPES) ×3 IMPLANT
DRSG TEGADERM 4X4.75 (GAUZE/BANDAGES/DRESSINGS) ×3 IMPLANT
ELECT PENCIL ROCKER SW 15FT (MISCELLANEOUS) IMPLANT
ELECT REM PT RETURN 15FT ADLT (MISCELLANEOUS) ×3 IMPLANT
GAUZE 4X4 16PLY ~~LOC~~+RFID DBL (SPONGE) IMPLANT
GAUZE SPONGE 2X2 8PLY STRL LF (GAUZE/BANDAGES/DRESSINGS) IMPLANT
GLOVE BIO SURGEON STRL SZ 6.5 (GLOVE) ×3 IMPLANT
GLOVE BIOGEL PI IND STRL 7.5 (GLOVE) ×2 IMPLANT
GLOVE BIOGEL PI INDICATOR 7.5 (GLOVE) ×1
GLOVE SURG LX 7.5 STRW (GLOVE) ×2
GLOVE SURG LX STRL 7.5 STRW (GLOVE) ×4 IMPLANT
HOLDER FOLEY CATH W/STRAP (MISCELLANEOUS) ×3 IMPLANT
IRRIG SUCT STRYKERFLOW 2 WTIP (MISCELLANEOUS) ×3
IRRIGATION SUCT STRKRFLW 2 WTP (MISCELLANEOUS) ×2 IMPLANT
IV LACTATED RINGERS 1000ML (IV SOLUTION) ×3 IMPLANT
KIT PROCEDURE DA VINCI SI (MISCELLANEOUS) ×3
KIT PROCEDURE DVNC SI (MISCELLANEOUS) ×2 IMPLANT
KIT TURNOVER KIT A (KITS) IMPLANT
NDL INSUFFLATION 14GA 120MM (NEEDLE) ×2 IMPLANT
NDL SPNL 22GX7 QUINCKE BK (NEEDLE) ×2 IMPLANT
NEEDLE INSUFFLATION 14GA 120MM (NEEDLE) ×3 IMPLANT
NEEDLE SPNL 22GX7 QUINCKE BK (NEEDLE) ×3 IMPLANT
PACK ROBOT UROLOGY CUSTOM (CUSTOM PROCEDURE TRAY) ×3 IMPLANT
PAD POSITIONING PINK XL (MISCELLANEOUS) ×3 IMPLANT
PORT ACCESS TROCAR AIRSEAL 12 (TROCAR) ×2 IMPLANT
PORT ACCESS TROCAR AIRSEAL 5M (TROCAR) ×1
RELOAD 45 THICK GREEN (ENDOMECHANICALS) ×3 IMPLANT
RELOAD STAPLE 45 4.1 GRN THCK (STAPLE) ×2 IMPLANT
RELOAD STAPLE 45 GRN THCK ETS (ENDOMECHANICALS) IMPLANT
SEAL CANN UNIV 5-8 DVNC XI (MISCELLANEOUS) ×8 IMPLANT
SEAL XI 5MM-8MM UNIVERSAL (MISCELLANEOUS) ×12
SEALER SYNCHRO 8 IS4000 DV (MISCELLANEOUS) ×3
SEALER SYNCHRO 8 IS4000 DVNC (MISCELLANEOUS) IMPLANT
SET TRI-LUMEN FLTR TB AIRSEAL (TUBING) ×3 IMPLANT
SOLUTION ELECTROLUBE (MISCELLANEOUS) ×3 IMPLANT
SPIKE FLUID TRANSFER (MISCELLANEOUS) ×3 IMPLANT
SPONGE GAUZE 2X2 STER 10/PKG (GAUZE/BANDAGES/DRESSINGS)
SPONGE T-LAP 4X18 ~~LOC~~+RFID (SPONGE) ×3 IMPLANT
STAPLE RELOAD 45 GRN (STAPLE) IMPLANT
STAPLE RELOAD 45MM GREEN (STAPLE)
SUT ETHILON 3 0 PS 1 (SUTURE) ×3 IMPLANT
SUT MNCRL AB 4-0 PS2 18 (SUTURE) ×6 IMPLANT
SUT PDS AB 1 CT1 27 (SUTURE) ×6 IMPLANT
SUT VIC AB 2-0 CT1 27 (SUTURE) ×3
SUT VIC AB 2-0 CT1 27XBRD (SUTURE) IMPLANT
SUT VIC AB 2-0 SH 27 (SUTURE) ×3
SUT VIC AB 2-0 SH 27X BRD (SUTURE) ×2 IMPLANT
SUT VIC AB 3-0 SH 27 (SUTURE) ×6
SUT VIC AB 3-0 SH 27XBRD (SUTURE) IMPLANT
SUT VICRYL 0 UR6 27IN ABS (SUTURE) ×3 IMPLANT
SUT VLOC BARB 180 ABS3/0GR12 (SUTURE) ×9
SUTURE VLOC BRB 180 ABS3/0GR12 (SUTURE) ×6 IMPLANT
SYR 27GX1/2 1ML LL SAFETY (SYRINGE) ×3 IMPLANT
SYS BAG RETRIEVAL 10MM (BASKET) ×2
SYSTEM BAG RETRIEVAL 10MM (BASKET) IMPLANT
TOWEL OR NON WOVEN STRL DISP B (DISPOSABLE) ×3 IMPLANT
TROCAR XCEL NON-BLD 5MMX100MML (ENDOMECHANICALS) IMPLANT
WATER STERILE IRR 1000ML POUR (IV SOLUTION) ×3 IMPLANT

## 2021-08-18 NOTE — Anesthesia Procedure Notes (Signed)
Procedure Name: Intubation ?Date/Time: 08/18/2021 12:24 PM ?Performed by: Claudia Desanctis, CRNA ?Pre-anesthesia Checklist: Emergency Drugs available, Suction available, Patient identified and Patient being monitored ?Patient Re-evaluated:Patient Re-evaluated prior to induction ?Oxygen Delivery Method: Circle system utilized ?Preoxygenation: Pre-oxygenation with 100% oxygen ?Induction Type: IV induction ?Ventilation: Mask ventilation without difficulty ?Laryngoscope Size: Glidescope and 4 ?Grade View: Grade I ?Tube type: Oral ?Tube size: 7.5 mm ?Number of attempts: 1 ?Airway Equipment and Method: Video-laryngoscopy ?Placement Confirmation: ETT inserted through vocal cords under direct vision, positive ETCO2 and breath sounds checked- equal and bilateral ?Secured at: 23 cm ?Tube secured with: Tape ?Dental Injury: Teeth and Oropharynx as per pre-operative assessment  ?Difficulty Due To: Difficulty was anticipated, Difficult Airway- due to reduced neck mobility, Difficult Airway- due to dentition and Difficult Airway- due to anterior larynx ? ? ? ? ?

## 2021-08-18 NOTE — H&P (Signed)
[name] is an 76 y.o. male.   ? ?Chief Complaint: Pre-Op Prostatectomy / Node Dissection ? ?HPI:  ? ?1 - High Risk Prostate Cancer - 11/12 coers up to 50% grade 5 cancer by TRUS BX 06/2021 oneval rising PSA to 10.1 at age 59. TRUS 122 mL. small median. CT and Bone Scan 06/2021 localized. ? ? ??2 - Lower Urinary Tract Symptoms - on tamsulosin at baseline with minimal change I mostly irritative symptoms. DRE 2023 45gm. PVR 2023 "29 mL" (normal). . ? ? ??PMH sig for HTN, HLD, inguinal hernia repair, moderate obesity. NO ischemic CV disease / blood thinners. 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 His wife Izora Gala is very invovlved. Lots of kids/grandkids in [place] area. His PCP is M. Veronia Beets MD ?? ? ??Today " [name] " is seen to proceed with robotic prostatectomy / node dissection for very high risk cancer in setting of very large prostate. Nointerval fevers. Most recent UCX e.coli sens cipro, ancef, gent. Hgb 13.8. He has been on augmentin proph pre-op.  ? ? ? ?Past Medical History:  ?Diagnosis Date  ? Allergy   ? BACK PAIN, CHRONIC 03/12/2007  ? [facility])   ? prostate  ? Cataract   ? Compression fracture of thoracic vertebra ([facility])   ? traumatic    T8  ? HYPERLIPIDEMIA 08/18/2008  ? HYPERTENSION 03/12/2007  ? IGT (impaired glucose tolerance)   ? OSTEOARTHRITIS 03/12/2007  ? UTI (urinary tract infection)   ? ? ?Past Surgical History:  ?Procedure Laterality Date  ? CARDIAC CATHETERIZATION  2010  ? CATARACT EXTRACTION, BILATERAL    ? COLONOSCOPY    ? DENTAL SURGERY    ? EYE SURGERY    ? cataract  ? HERNIA REPAIR    ? ingunial  ? POLYPECTOMY    ? ? ?Family History  ?Problem Relation Age of Onset  ? Diabetes Mother   ? Renal cancer Mother   ? Macular degeneration Mother   ? Heart disease Father   ? Stroke Father   ? Obesity Sister   ?     she had lap band  ? Colon cancer Neg Hx   ? Colon polyps Neg Hx   ? ?Social History:  reports that he quit smoking about 51 years  ago. His smoking use included cigarettes. He has never used smokeless tobacco. He reports current alcohol use of about 14.0 standard drinks per week. He reports that he does not use drugs. ? ?Allergies:  ?Allergies  ?Allergen Reactions  ? Lisinopril Cough  ? ? ?No medications prior to admission.  ? ? ?No results found for this or any previous visit (from the past 48 hour(s)). ?No results found. ? ?Review of Systems  ?Constitutional:  Negative for chills and fever.  ?Genitourinary:  Positive for urgency.  ?All other systems reviewed and are negative. ? ?There were no vitals taken for this visit. ?Physical Exam ?Vitals reviewed.  ?HENT:  ?   Head: Normocephalic.  ?   Nose: Nose normal.  ?   Mouth/Throat:  ?   Mouth: Mucous membranes are moist.  ?Eyes:  ?   Pupils: Pupils are equal, round, and reactive to light.  ?Cardiovascular:  ?   Rate and Rhythm: Normal rate.  ?Pulmonary:  ?   Effort: Pulmonary effort is normal.  ?Abdominal:  ?   General: Abdomen is flat.  ?Genitourinary: ?   Penis: Normal.   ?Musculoskeletal:     ?  General: Normal range of motion.  ?   Cervical back: Normal range of motion.  ?Neurological:  ?   General: No focal deficit present.  ?   Mental Status: He is alert.  ?  ? ?Assessment/Plan ? ?Proceed as planned with robotic prostatectomy / node dissection for large volume aggressive prostate cancer iv very large prostate. Risks, benefits, alternatives, expected peri-op course discussed previously and reiterated today. He understands he may require multimodal therapy regardless of primary modality.  ? ?Alexis Frock, MD ?08/18/2021, 5:33 AM ? ? ? ?

## 2021-08-18 NOTE — Anesthesia Postprocedure Evaluation (Signed)
Anesthesia Post Note ? ?Patient: [name] ? ?Procedure(s) Performed: XI ROBOTIC ASSISTED LAPAROSCOPIC RADICAL PROSTATECTOMY (Abdomen) ?PELVIC LYMPH NODE DISSECTION (Bilateral: Abdomen) ? ?  ? ?Patient location during evaluation: PACU ?Anesthesia Type: General ?Level of consciousness: awake ?Pain management: pain level controlled ?Vital Signs Assessment: post-procedure vital signs reviewed and stable ?Respiratory status: spontaneous breathing and respiratory function stable ?Cardiovascular status: stable ?Postop Assessment: no apparent nausea or vomiting ?Anesthetic complications: no ? ? ?No notable events documented. ? ?Last Vitals:  ?Vitals:  ? 08/18/21 1026 08/18/21 1545  ?BP: (!) 150/75   ?Pulse: 83   ?Resp: 16   ?Temp: 36.8 ?C 36.4 ?C  ?SpO2: 97%   ?  ?Last Pain:  ?Vitals:  ? 08/18/21 1600  ?TempSrc:   ?PainSc: 5   ? ? ?  ?  ?  ?  ?  ?  ? ?Merlinda Frederick ? ? ? ? ?

## 2021-08-18 NOTE — Brief Op Note (Signed)
08/18/2021 ? ?3:30 PM ? ?PATIENT:  [name]  76 y.o. male ? ?PRE-OPERATIVE DIAGNOSIS:  PROSTATE CANCER ? ?POST-OPERATIVE DIAGNOSIS:  PROSTATE CANCER ? ?PROCEDURE:  Procedure(s): ?XI ROBOTIC ASSISTED LAPAROSCOPIC RADICAL PROSTATECTOMY (N/A) ?PELVIC LYMPH NODE DISSECTION (Bilateral) ? ?SURGEON:  Surgeon(s) and Role: ?   Alexis Frock, MD - Primary ? ?PHYSICIAN ASSISTANT:  ? ?ASSISTANTS: Debbrah Alar PA  ? ?ANESTHESIA:   local and general ? ?EBL:  300 mL  ? ?BLOOD ADMINISTERED:none ? ?DRAINS:  1 - JP to bulb; 2 - Foley to gravity   ? ?LOCAL MEDICATIONS USED:  MARCAINE    ? ?SPECIMEN:  Source of Specimen:  1 - pelvic lymph nodes; 2 - prostatectomy; 3 - periprostatic fat ? ?DISPOSITION OF SPECIMEN:  PATHOLOGY ? ?COUNTS:  YES ? ?TOURNIQUET:  * No tourniquets in log * ? ?DICTATION: .Other Dictation: Dictation Number [id number] ? ?PLAN OF CARE: Admit for overnight observation ? ?PATIENT DISPOSITION:  PACU - hemodynamically stable. ?  ?Delay start of Pharmacological VTE agent (>24hrs) due to surgical blood loss or risk of bleeding: yes ? ?

## 2021-08-18 NOTE — Discharge Instructions (Addendum)

## 2021-08-18 NOTE — Progress Notes (Signed)
Patient arrived in room 1410 from PACU.  PRN pain medication given per patient request.  Patient denies nausea.  AAOx4.  Wife at bedside. ? ?Angie Fava, RN  ?

## 2021-08-18 NOTE — Anesthesia Preprocedure Evaluation (Addendum)
Anesthesia Evaluation  ?Patient identified by MRN, date of birth, ID band ?Patient awake ? ? ? ?Reviewed: ?Allergy & Precautions, NPO status , Patient's Chart, lab work & pertinent test results ? ?Airway ?Mallampati: III ? ?TM Distance: >3 FB ?Neck ROM: Full ? ? ? Dental ?no notable dental hx. ? ?  ?Pulmonary ?neg pulmonary ROS, former smoker,  ?  ?Pulmonary exam normal ?breath sounds clear to auscultation ? ? ? ? ? ? Cardiovascular ?hypertension, + CAD  ?Normal cardiovascular exam+ dysrhythmias (LBBB)  ?Rhythm:Regular Rate:Normal ? ? ?  ?Neuro/Psych ?negative neurological ROS ? negative psych ROS  ? GI/Hepatic ?negative GI ROS, Neg liver ROS,   ?Endo/Other  ?obesity ? Renal/GU ?negative Renal ROS  ? ?Prostate cancer ? ?  ?Musculoskeletal ? ?(+) Arthritis , Osteoarthritis,  Chronic pain  ? Abdominal ?  ?Peds ?negative pediatric ROS ?(+)  Hematology ?negative hematology ROS ?(+)   ?Anesthesia Other Findings ? ? Reproductive/Obstetrics ?negative OB ROS ? ?  ? ? ? ? ? ? ? ? ? ? ? ? ? ?  ?  ? ? ? ? ? ? ?Anesthesia Physical ?Anesthesia Plan ? ?ASA: 3 ? ?Anesthesia Plan: General  ? ?Post-op Pain Management: Minimal or no pain anticipated  ? ?Induction: Intravenous ? ?PONV Risk Score and Plan: 2 and Treatment may vary due to age or medical condition, Ondansetron and Dexamethasone ? ?Airway Management Planned: Oral ETT ? ?Additional Equipment: None ? ?Intra-op Plan:  ? ?Post-operative Plan: Extubation in OR ? ?Informed Consent: I have reviewed the patients History and Physical, chart, labs and discussed the procedure including the risks, benefits and alternatives for the proposed anesthesia with the patient or authorized representative who has indicated his/her understanding and acceptance.  ? ? ? ?Dental advisory given ? ?Plan Discussed with: CRNA and Anesthesiologist ? ?Anesthesia Plan Comments:   ? ? ? ? ? ?Anesthesia Quick Evaluation ? ?

## 2021-08-18 NOTE — Transfer of Care (Signed)
Immediate Anesthesia Transfer of Care Note ? ?Patient: [name] ? ?Procedure(s) Performed: XI ROBOTIC ASSISTED LAPAROSCOPIC RADICAL PROSTATECTOMY (Abdomen) ?PELVIC LYMPH NODE DISSECTION (Bilateral: Abdomen) ? ?Patient Location: PACU ? ?Anesthesia Type:General ? ?Level of Consciousness: awake, alert , oriented and patient cooperative ? ?Airway & Oxygen Therapy: Patient Spontanous Breathing and Patient connected to face mask oxygen ? ?Post-op Assessment: Report given to RN, Post -op Vital signs reviewed and stable and Patient moving all extremities X 4 ? ?Post vital signs: Reviewed and stable ? ?Last Vitals:  ?Vitals Value Taken Time  ?BP 127/86 08/18/21 1545  ?Temp    ?Pulse 85 08/18/21 1549  ?Resp 15 08/18/21 1548  ?SpO2 100 % 08/18/21 1549  ?Vitals shown include unvalidated device data. ? ?Last Pain:  ?Vitals:  ? 08/18/21 1045  ?TempSrc:   ?PainSc: 0-No pain  ?   ? ?Patients Stated Pain Goal: 3 (08/18/21 1045) ? ?Complications: No notable events documented. ?

## 2021-08-19 ENCOUNTER — Encounter (HOSPITAL_COMMUNITY): Payer: Self-pay | Admitting: Urology

## 2021-08-19 DIAGNOSIS — I10 Essential (primary) hypertension: Secondary | ICD-10-CM | POA: Diagnosis not present

## 2021-08-19 DIAGNOSIS — Z8349 Family history of other endocrine, nutritional and metabolic diseases: Secondary | ICD-10-CM | POA: Diagnosis not present

## 2021-08-19 DIAGNOSIS — M1990 Unspecified osteoarthritis, unspecified site: Secondary | ICD-10-CM | POA: Diagnosis not present

## 2021-08-19 DIAGNOSIS — Z79899 Other long term (current) drug therapy: Secondary | ICD-10-CM | POA: Diagnosis not present

## 2021-08-19 DIAGNOSIS — E785 Hyperlipidemia, unspecified: Secondary | ICD-10-CM | POA: Diagnosis not present

## 2021-08-19 DIAGNOSIS — R3915 Urgency of urination: Secondary | ICD-10-CM | POA: Diagnosis not present

## 2021-08-19 DIAGNOSIS — C774 Secondary and unspecified malignant neoplasm of inguinal and lower limb lymph nodes: Secondary | ICD-10-CM | POA: Diagnosis not present

## 2021-08-19 DIAGNOSIS — Z87891 Personal history of nicotine dependence: Secondary | ICD-10-CM | POA: Diagnosis not present

## 2021-08-19 DIAGNOSIS — C61 Malignant neoplasm of prostate: Secondary | ICD-10-CM | POA: Diagnosis not present

## 2021-08-19 DIAGNOSIS — I447 Left bundle-branch block, unspecified: Secondary | ICD-10-CM | POA: Diagnosis not present

## 2021-08-19 DIAGNOSIS — Z8249 Family history of ischemic heart disease and other diseases of the circulatory system: Secondary | ICD-10-CM | POA: Diagnosis not present

## 2021-08-19 DIAGNOSIS — G8929 Other chronic pain: Secondary | ICD-10-CM | POA: Diagnosis not present

## 2021-08-19 DIAGNOSIS — I2510 Atherosclerotic heart disease of native coronary artery without angina pectoris: Secondary | ICD-10-CM | POA: Diagnosis not present

## 2021-08-19 LAB — CBC
HCT: 37.4 % — ABNORMAL LOW (ref 39.0–52.0)
Hemoglobin: 12.1 g/dL — ABNORMAL LOW (ref 13.0–17.0)
MCH: 29 pg (ref 26.0–34.0)
MCHC: 32.4 g/dL (ref 30.0–36.0)
MCV: 89.7 fL (ref 80.0–100.0)
Platelets: 214 10*3/uL (ref 150–400)
RBC: 4.17 MIL/uL — ABNORMAL LOW (ref 4.22–5.81)
RDW: 13.5 % (ref 11.5–15.5)
WBC: 12.7 10*3/uL — ABNORMAL HIGH (ref 4.0–10.5)
nRBC: 0 % (ref 0.0–0.2)

## 2021-08-19 LAB — BASIC METABOLIC PANEL
Anion gap: 7 (ref 5–15)
BUN: 13 mg/dL (ref 8–23)
CO2: 24 mmol/L (ref 22–32)
Calcium: 8.3 mg/dL — ABNORMAL LOW (ref 8.9–10.3)
Chloride: 100 mmol/L (ref 98–111)
Creatinine, Ser: 0.88 mg/dL (ref 0.61–1.24)
GFR, Estimated: >60 mL/min (ref >60)
Glucose, Bld: 232 mg/dL — ABNORMAL HIGH (ref 70–99)
Potassium: 4.2 mmol/L (ref 3.5–5.1)
Sodium: 131 mmol/L — ABNORMAL LOW (ref 135–145)

## 2021-08-19 NOTE — Plan of Care (Signed)
  Problem: Activity: Goal: Risk for activity intolerance will decrease Outcome: Progressing   Problem: Nutrition: Goal: Adequate nutrition will be maintained Outcome: Progressing   

## 2021-08-19 NOTE — Care Management Obs Status (Signed)
MEDICARE OBSERVATION STATUS NOTIFICATION ? ? ?Patient Details  ?Name: [name] ?MRN: [id number] ?Date of Birth: March 30, 1946 ? ? ?Medicare Observation Status Notification Given:  Yes ? ? ? ?Dessa Phi, RN ?08/19/2021, 2:33 PM ?

## 2021-08-19 NOTE — Plan of Care (Signed)
?  Problem: Education: ?Goal: Knowledge of General Education information will improve ?Description: Including pain rating scale, medication(s)/side effects and non-pharmacologic comfort measures ?Outcome: Adequate for Discharge ?  ?Problem: Health Behavior/Discharge Planning: ?Goal: Ability to manage health-related needs will improve ?Outcome: Adequate for Discharge ?  ?Problem: Clinical Measurements: ?Goal: Ability to maintain clinical measurements within normal limits will improve ?Outcome: Adequate for Discharge ?Goal: Will remain free from infection ?Outcome: Adequate for Discharge ?Goal: Diagnostic test results will improve ?Outcome: Adequate for Discharge ?Goal: Respiratory complications will improve ?Outcome: Adequate for Discharge ?Goal: Cardiovascular complication will be avoided ?Outcome: Adequate for Discharge ?  ?Problem: Activity: ?Goal: Risk for activity intolerance will decrease ?08/19/2021 1806 by Saunders Glance, RN ?Outcome: Adequate for Discharge ?08/19/2021 1413 by Saunders Glance, RN ?Outcome: Progressing ?  ?Problem: Nutrition: ?Goal: Adequate nutrition will be maintained ?08/19/2021 1806 by Saunders Glance, RN ?Outcome: Adequate for Discharge ?08/19/2021 1413 by Saunders Glance, RN ?Outcome: Progressing ?  ?Problem: Coping: ?Goal: Level of anxiety will decrease ?Outcome: Adequate for Discharge ?  ?Problem: Elimination: ?Goal: Will not experience complications related to bowel motility ?Outcome: Adequate for Discharge ?Goal: Will not experience complications related to urinary retention ?Outcome: Adequate for Discharge ?  ?Problem: Pain Managment: ?Goal: General experience of comfort will improve ?Outcome: Adequate for Discharge ?  ?Problem: Safety: ?Goal: Ability to remain free from injury will improve ?Outcome: Adequate for Discharge ?  ?Problem: Skin Integrity: ?Goal: Risk for impaired skin integrity will decrease ?Outcome: Adequate for Discharge ?  ?

## 2021-08-19 NOTE — Progress Notes (Signed)
1 Day Post-Op s/p robotic radical prostatectomy with bilateral pelvic lymph node dissection. ? ? ?Subjective: ?No acute events overnight.  ?Pain well controlled with prns  ?Ambulated x 3  ?Tolerating clears  ?Abdomen feels tight and distended. No nausea or vomiting. ?No flatus yet  ? ?Objective: ?Vital signs in last 24 hours: ?Temp:  [97.4 ?F (36.3 ?C)-98.3 ?F (36.8 ?C)] 98.3 ?F (36.8 ?C) (05/04 0431) ?Pulse Rate:  [79-92] 79 (05/04 0431) ?Resp:  [14-22] 16 (05/04 0431) ?BP: (127-164)/(70-92) 152/74 (05/04 0431) ?SpO2:  [92 %-97 %] 95 % (05/04 0431) ?Weight:  [100.5 kg] 100.5 kg (05/03 1706) ? ?Intake/Output from previous day: ?05/03 0701 - 05/04 0700 ?In: 2225.8 [P.O.:240; I.V.:1536.8; IV Piggyback:449] ?Out: 1625 [Urine:1150; Drains:175; Blood:300] ?Intake/Output this shift: ?No intake/output data recorded. ? ?Physical Exam:  ?General: Alert and oriented ?CV: RRR ?Lungs: Clear ?Abdomen: Soft, nontender, moderately distended and taut. LLQ JP drain with serosanguinous output  ?GU: Foley catheter in place, urine is clear yellow  ?Incisions: ?Ext: NT, No erythema ? ?Lab Results: ?Recent Labs  ?  08/18/21 ?1713 08/19/21 ?0446  ?HGB 14.3 12.1*  ?HCT 43.9 37.4*  ? ?BMET ?Recent Labs  ?  08/18/21 ?1713 08/19/21 ?0446  ?NA 136 131*  ?K 4.2 4.2  ?CL 103 100  ?CO2 23 24  ?GLUCOSE 149* 232*  ?BUN 18 13  ?CREATININE 1.00 0.88  ?CALCIUM 8.5* 8.3*  ? ? ? ?Studies/Results: ?No results found. ? ?Assessment/Plan: ?76 y/o male with prostate cancer s/p robotic radical prostatectomy with bilateral pelvic lymph node dissection on 08/18/21. ? ?Doing well post operatively. With abdominal distension this morning, has not passed flatus.  ? ?-Stop mIVF as taking in adequate PO fluids  ?-Advance to regular diet  ?-Ambulate x 3 and up to chair for most of the day ?-Scheduled tylenol/toradol and prn oxyocodone for pain.  ?-Continue foley catheter. Patient to discharge with foley.  ?-Remove JP drain prior to discharge ?-Possible discharge later  this afternoon versus 5/5 pending abdominal exam and clinical progress today.  ? ? LOS: 0 days  ? ?[name] ?08/19/2021, 7:19 AM ? ?  ?

## 2021-08-19 NOTE — Op Note (Signed)
NAME: [name], [name] ?MEDICAL RECORD NO: [id number] ?ACCOUNT NO: [id number] ?DATE OF BIRTH: 06-28-1945 ?FACILITY: [facility] ?LOCATION: [facility] ?PHYSICIAN: Alexis Frock, MD ? ?Operative Report  ? ?DATE OF PROCEDURE: 08/18/2021 ? ?PREOPERATIVE DIAGNOSIS:  High risk prostate cancer. ? ?PROCEDURES:  Robotic-assisted laparoscopic radical prostatectomy with bilateral pelvic lymphadenectomy and ICG sentinel lymphangiography. ? ?ESTIMATED BLOOD LOSS:  300 mL. ? ?COMPLICATIONS:  None. ? ?SPECIMENS:  ?1.  Periprostatic fat. ?2.  Right external iliac lymph nodes sentinel. ?3.  Right obturator lymph nodes sentinel. ?4.  Left external iliac lymph nodes sentinel. ?5.  Left obturator lymph nodes sentinel. ?6.  Radical prostatectomy. ? ?ASSISTANT:  Debbrah Alar, PA ? ?DRAINS:  ?1.  Jackson-Pratt drain to bulb suction. ?2.  Foley catheter to straight drain. ? ?FINDINGS:  ?1.  Very large trilobar prostatic hypertrophy as anticipated. ?2.  Multifocal sentinel lymph nodes denoted on pathology requisition. ?3.  Significant redundancy of the sigmoid colon with diverticulosis.  No evidence of active diverticulitis. ? ?INDICATIONS:  The patient is a very pleasant 76 year old man who was found on workup of elevated PSA to have multifocal large volume very high risk adenocarcinoma of the prostate.  Staging imaging with CT and bone scan was clinically localized.  Options  ?were discussed for management including surveillance protocols versus ablative therapy versus surgical extirpation.  Given his baseline obstructive symptoms with significant hypertrophy, it was felt that primary therapy with radical prostatectomy would  ?be most prudent.  He wished to proceed.  Informed consent was obtained and placed in medical record.  He is very well aware that his course may require multimodal approach. ? ?PROCEDURE IN DETAIL:  The patient being verified, procedure being radical prostatectomy with lymph node dissection was confirmed.  Procedure timeout was  performed.  Intravenous antibiotics were administered.  General endotracheal anesthesia induced.  The ? patient was placed into a low lithotomy position.  Sterile field was created, prepping and draping the patient's penis, perineum, and proximal thighs using iodine and his infra-xiphoid abdomen using chlorhexidine gluconate.  Foley catheter was placed  ?per urethra for straight drain.  His arms were tucked to the side with gel rolls.  He was further fashioned to the operative table using 3-inch tape with foam padding across supraxiphoid chest.  Next, a high flow, low pressure pneumoperitoneum was  ?obtained using Veress technique in the supraumbilical midline having passed the aspiration and drop test.  An 8 mm robotic camera port was then placed in the same location.  Laparoscopic examination of the peritoneal cavity revealed no significant  ?adhesions, no visceral injury.  There was immediately noted a very large volume of redundant sigmoid colon with significant diverticulosis.  No evidence of active diverticulitis.  Additional ports were placed as follows:  Right paramedian 8 mm robotic  ?port, right far lateral 12 mm AirSeal assist port, right paramedian 5 mm suction port, left paramedian 8 mm robotic port, left far lateral 8 mm robotic port.  Robot was docked and passed the electronic checks.  Initial attention was directed at limited  ?adhesiolysis.  Multiple loose adhesions were taken down of the very redundant sigmoid and the left pelvic sidewall and peritoneal surface of the bladder.  This allowed significant reduction of the sigmoid redundancy and significantly more room in the  ?deep pelvis.  Attention was directed at development of space of Retzius.  Incision was made lateral to the right median umbilical ligament from the midline towards the internal ring coursing along the iliac vessels towards the area of  the right ureter,  ?which was positively identified.  There was evidence of a prior mesh  inguinal hernia repair and the mesh was left in place.  Dissection proceeded perfectly around this.  The right vas deferens was encountered, purposely ligated and used as a medial  ?bucket handle and lateral bladder was swept away from the pelvic sidewall towards the area of the endopelvic fascia on the right side.  A mirror image dissection was performed on the left side.  Anterior attachment was taken down with cautery scissors to ? expose the anterior base of the prostate, which was defatted to better denote the prostate, bladder neck junction.  This was set aside labeled as periprostatic fat.  Next, 0.2 mL indocyanine green dye was injected in each lobe of the prostate using a  ?percutaneously placed, robotically guided spinal needle with intervening suctioning to prevent dye spillage, which did not occur.  Next, the endopelvic fascia was carefully swept away from the lateral aspect of the prostate in base to apex orientation.   ?This exposed the dorsal venous complex, was controlled using green load stapler, taking exquisite care to avoid membranous urethral injury, which did not occur.  It had been approximately 10 minutes post-dye injection, the pelvis was inspected under near ? infrared fluorescence light. ? ?Sentinel lymphangiography revealed excellent parenchymal uptake of the prostate with multiple lymph node channels seen coursing towards the pelvic lymph node fields bilaterally.  Next, standard template lymphadenectomy was performed on the right side,  ?first the right external iliac group with boundaries being right external iliac artery, vein, pelvic sidewall, iliac bifurcation.  Lymphostasis achieved with cold clips, set aside labeled as right external lymph nodes sentinel.  Next, right obturator  ?group was dissected free with the boundaries being right external iliac vein, pelvic sidewall, obturator nerve.  Lymphostasis achieved with cold clips, set aside labeled as right obturator lymph nodes  sentinel.  Next, a mirror image dissection was  ?performed on the left side, the left external iliac and left obturator groups respectively.  Bilateral obturator nerves were inspected following these maneuvers and found to be uninjured.  Attention was directed at bladder neck dissection.  Bladder neck  ?was identified, moving the Foley catheter back and forth and a very careful lateral release was performed on each side to better denote the bladder neck caliber.  Bladder neck was then carefully separated away from the base of the prostate in anterior  ?and posterior direction, keeping what appeared to be a rim of circular muscle fibers with each plane of dissection.  As anticipated, there was a significant median lobe.  This was delivered in the operative field and a large stay suture of Vicryl was  ?placed and this did provide anterior superior traction. With this better visualization, the posterior aspect of the bladder neck was very carefully tailored but inherently, the median lobe was of wider caliber.  Posterior dissection was performed  ?very carefully following the plane of Denonvilliers.  Bilateral seminal vesicles were encountered as well as the vas deferens.  Vas were dissected for approximately 4 cm, ligated, and placed on gentle superior traction.  Bilateral seminal vesicles were  ?dissected to their tip and placed on gentle superior traction.  Dissection proceeded within this plane of Denonvilliers, anterior to the perirectal fat all the way to the apex of the prostate.  This exposed the vascular pedicles on each side, which were  ?quite thick and vascular. These were controlled using a sequential bipolar energy resection, approximately  4 mm increments from the base all the way to the apex, taking exquisite care to avoid any injury to the rectal tissue, which did not occur grossly. ?  Final apical dissection was performed by placing the prostate on superior traction.  This exposed the membranous  urethra, which was carefully transected.  This completely freed up the prostatectomy specimen, was placed into EndoCatch bag for later  ?retrieval.  Next, digital rectal exam was performed using indicator glove and laparo

## 2021-08-19 NOTE — Progress Notes (Signed)
?  Transition of Care (TOC) Screening Note ? ? ?Patient Details  ?Name: [name] ?Date of Birth: 03-Nov-1945 ? ? ?Transition of Care [facility]) CM/SW Contact:    ?Dessa Phi, RN ?Phone Number: ?08/19/2021, 2:33 PM ? ? ? ?Transition of Care Department [facility]) has reviewed patient and no TOC needs have been identified at this time. We will continue to monitor patient advancement through interdisciplinary progression rounds. If new patient transition needs arise, please place a TOC consult. ?  ?

## 2021-08-19 NOTE — Progress Notes (Signed)
Patient given discharge, medication, follow up, and catheter care instructions with teachback, verbalized understanding, IV x 2 and JP drain removed, personal belongings with patient, spouse to transport home  ?

## 2021-08-19 NOTE — Discharge Summary (Addendum)
Date of admission: 08/18/2021 ? ?Date of discharge: 08/19/2021 ? ?Admission diagnosis: Prostate cancer  ? ?Discharge diagnosis: Prostate cancer  ? ?Secondary diagnoses:  ?Patient Active Problem List  ? Diagnosis Date Noted  ? Prostate cancer ([place]) 08/18/2021  ? Impaired glucose tolerance 04/07/2015  ? History of colonic polyps 12/23/2014  ? Left bundle branch block 09/06/2012  ? Dyslipidemia 08/18/2008  ? Coronary atherosclerosis 08/18/2008  ? Essential hypertension 03/12/2007  ? Osteoarthritis 03/12/2007  ? Back pain 03/12/2007  ? ? ?Procedures performed: ?Procedure(s): ?XI ROBOTIC ASSISTED LAPAROSCOPIC RADICAL PROSTATECTOMY ?PELVIC LYMPH NODE DISSECTION ? ?History and Physical: For full details, please see admission history and physical. Briefly, [name] is a 76 y.o. year old patient with prostate cancer who underwent RALP on 08/18/21.  ? ?Hospital Course: Patient tolerated the procedure well.  He was then transferred to the floor after an uneventful PACU stay.  His hospital course was uncomplicated. He began walking on POD0 overnight without issues. His pain was managed with PO medications. He was tolerating a clear diet so was advanced to regular on POD1. His abdomen initially felt distended on POD1 but he began passing flatus and his discomfort significantly improved.  ? ? On POD#1 he had met discharge criteria: was eating a regular diet, was up and ambulating independently,  pain was well controlled, catheter was draining clear yellow, and was ready to for discharge. His JP drain had minimal output so was removed prior to discharge. He will follow up on 5/16 for catheter removal. Given positive pre-op cultures he will take 3 days of augmentin for pericath pull abx.  ? ? ?Laboratory values:  ?Recent Labs  ?  08/18/21 ?1713 08/19/21 ?0446  ?WBC  --  12.7*  ?HGB 14.3 12.1*  ?HCT 43.9 37.4*  ? ?Recent Labs  ?  08/18/21 ?1713 08/19/21 ?0446  ?NA 136 131*  ?K 4.2 4.2  ?CL 103 100  ?CO2 23 24  ?GLUCOSE 149* 232*  ?BUN  18 13  ?CREATININE 1.00 0.88  ?CALCIUM 8.5* 8.3*  ? ?No results for input(s): LABPT, INR in the last 72 hours. ?No results for input(s): LABURIN in the last 72 hours. ?No results found for this or any previous visit. ? ?Disposition: Home ? ?Discharge instruction: The patient was instructed to be ambulatory but told to refrain from heavy lifting, strenuous activity, or driving.  ? ?Discharge medications:  ?Allergies as of 08/19/2021   ? ?   Reactions  ? Lisinopril Cough  ? ?  ? ?  ?Medication List  ?  ? ?STOP taking these medications   ? ?aspirin 81 MG tablet ?  ?meloxicam 7.5 MG tablet ?Commonly known as: MOBIC ?  ? ?  ? ?TAKE these medications   ? ?amoxicillin-clavulanate 500-125 MG tablet ?Commonly known as: AUGMENTIN ?Take 1 tablet (500 mg total) by mouth 2 (two) times daily. Start day prior to follow up appt for foley cath removal ?What changed: additional instructions ?  ?docusate sodium 100 MG capsule ?Commonly known as: COLACE ?Take 1 capsule (100 mg total) by mouth 2 (two) times daily. ?  ?fluticasone 50 MCG/ACT nasal spray ?Commonly known as: FLONASE ?SPRAY 2 SPRAYS INTO EACH NOSTRIL EVERY DAY ?What changed: See the new instructions. ?  ?HYDROcodone-acetaminophen 5-325 MG tablet ?Commonly known as: Norco ?Take 1-2 tablets by mouth every 6 (six) hours as needed for moderate pain. ?  ?losartan 50 MG tablet ?Commonly known as: COZAAR ?TAKE 1 TABLET BY MOUTH EVERY DAY ?  ?simvastatin 20 MG tablet ?  Commonly known as: ZOCOR ?TAKE 1 TABLET BY MOUTH EVERY DAY IN THE EVENING ?  ? ?  ? ? ?Followup:  ? Follow-up Information   ? ? Alexis Frock, MD Follow up on 08/31/2021.   ?Specialty: Urology ?Why: at 9 AM for MD visit, pathology review, and catheter removal. ?Contact information: ?[address] ?[place] [address] ?[phone] ? ? ?  ?  ? ?  ?  ? ?  ? ?  ?

## 2021-08-26 LAB — SURGICAL PATHOLOGY

## 2021-09-15 DIAGNOSIS — M6281 Muscle weakness (generalized): Secondary | ICD-10-CM | POA: Diagnosis not present

## 2021-09-15 DIAGNOSIS — M62838 Other muscle spasm: Secondary | ICD-10-CM | POA: Diagnosis not present

## 2021-09-23 DIAGNOSIS — M6281 Muscle weakness (generalized): Secondary | ICD-10-CM | POA: Diagnosis not present

## 2021-09-23 DIAGNOSIS — M62838 Other muscle spasm: Secondary | ICD-10-CM | POA: Diagnosis not present

## 2021-09-30 DIAGNOSIS — M6281 Muscle weakness (generalized): Secondary | ICD-10-CM | POA: Diagnosis not present

## 2021-09-30 DIAGNOSIS — M62838 Other muscle spasm: Secondary | ICD-10-CM | POA: Diagnosis not present

## 2021-11-11 DIAGNOSIS — M6281 Muscle weakness (generalized): Secondary | ICD-10-CM | POA: Diagnosis not present

## 2021-11-11 DIAGNOSIS — M62838 Other muscle spasm: Secondary | ICD-10-CM | POA: Diagnosis not present

## 2021-12-02 DIAGNOSIS — M62838 Other muscle spasm: Secondary | ICD-10-CM | POA: Diagnosis not present

## 2021-12-02 DIAGNOSIS — M6281 Muscle weakness (generalized): Secondary | ICD-10-CM | POA: Diagnosis not present

## 2021-12-14 DIAGNOSIS — C775 Secondary and unspecified malignant neoplasm of intrapelvic lymph nodes: Secondary | ICD-10-CM | POA: Diagnosis not present

## 2021-12-22 DIAGNOSIS — Z961 Presence of intraocular lens: Secondary | ICD-10-CM | POA: Diagnosis not present

## 2021-12-22 DIAGNOSIS — H35373 Puckering of macula, bilateral: Secondary | ICD-10-CM | POA: Diagnosis not present

## 2022-01-05 DIAGNOSIS — Z1211 Encounter for screening for malignant neoplasm of colon: Secondary | ICD-10-CM | POA: Diagnosis not present

## 2022-01-05 DIAGNOSIS — Z130 Encounter for screening for diseases of the blood and blood-forming organs and certain disorders involving the immune mechanism: Secondary | ICD-10-CM | POA: Diagnosis not present

## 2022-01-05 DIAGNOSIS — E785 Hyperlipidemia, unspecified: Secondary | ICD-10-CM | POA: Diagnosis not present

## 2022-01-05 DIAGNOSIS — I10 Essential (primary) hypertension: Secondary | ICD-10-CM | POA: Diagnosis not present

## 2022-01-05 DIAGNOSIS — R42 Dizziness and giddiness: Secondary | ICD-10-CM | POA: Diagnosis not present

## 2022-06-16 ENCOUNTER — Telehealth: Payer: Self-pay | Admitting: Family Medicine

## 2022-06-16 NOTE — Telephone Encounter (Signed)
Contacted [name] to schedule their annual wellness visit. Patient declined to schedule AWV at this time.  [name] AWV direct phone # [phone]    Spoke with patient to remind him of phone appt with [facility].  Patient stated he has changed PCP

## 2023-10-06 ENCOUNTER — Other Ambulatory Visit (HOSPITAL_COMMUNITY): Payer: Self-pay | Admitting: Urology

## 2023-10-06 DIAGNOSIS — C775 Secondary and unspecified malignant neoplasm of intrapelvic lymph nodes: Secondary | ICD-10-CM

## 2023-10-06 DIAGNOSIS — C61 Malignant neoplasm of prostate: Secondary | ICD-10-CM

## 2023-10-26 ENCOUNTER — Encounter (HOSPITAL_COMMUNITY): Payer: Self-pay

## 2023-10-26 ENCOUNTER — Encounter (HOSPITAL_COMMUNITY)
Admission: RE | Admit: 2023-10-26 | Discharge: 2023-10-26 | Disposition: A | Discharge status: Home / Self Care | Source: Ambulatory Visit | Attending: Urology | Admitting: Urology

## 2023-10-26 ENCOUNTER — Ambulatory Visit (HOSPITAL_COMMUNITY)
Admission: RE | Admit: 2023-10-26 | Discharge: 2023-10-26 | Disposition: A | Discharge status: Home / Self Care | Source: Ambulatory Visit | Attending: Urology | Admitting: Urology

## 2023-10-26 DIAGNOSIS — C61 Malignant neoplasm of prostate: Secondary | ICD-10-CM

## 2023-10-26 DIAGNOSIS — C775 Secondary and unspecified malignant neoplasm of intrapelvic lymph nodes: Secondary | ICD-10-CM | POA: Insufficient documentation

## 2023-10-26 MED ORDER — TECHNETIUM TC 99M MEDRONATE IV KIT
20.0000 | PACK | Freq: Once | INTRAVENOUS | Status: AC | PRN
  Administered 2023-10-26: 20 via INTRAVENOUS

## 2023-10-26 MED ORDER — IOHEXOL 300 MG/ML  SOLN
100.0000 mL | Freq: Once | INTRAMUSCULAR | Status: AC | PRN
Start: 2023-10-26 — End: 2023-10-26
  Administered 2023-10-26: 100 mL via INTRAVENOUS
